# Patient Record
Sex: Male | Born: 1970 | Race: Black or African American | Hispanic: No | Marital: Married | Smoking: Former smoker
Health system: Southern US, Community
[De-identification: ages and names within clinical notes are randomized; demographics above are authoritative.]

## PROBLEM LIST (undated history)

## (undated) DIAGNOSIS — B009 Herpesviral infection, unspecified: Secondary | ICD-10-CM

## (undated) DIAGNOSIS — I10 Essential (primary) hypertension: Secondary | ICD-10-CM

---

## 2001-01-16 ENCOUNTER — Emergency Department (HOSPITAL_COMMUNITY): Admission: EM | Admit: 2001-01-16 | Discharge: 2001-01-16 | Payer: Self-pay | Admitting: Emergency Medicine

## 2010-08-01 ENCOUNTER — Emergency Department (HOSPITAL_COMMUNITY)
Admission: EM | Admit: 2010-08-01 | Discharge: 2010-08-01 | Disposition: A | Discharge status: Home / Self Care | Payer: Worker's Compensation | Attending: Emergency Medicine | Admitting: Emergency Medicine

## 2010-08-01 DIAGNOSIS — S91009A Unspecified open wound, unspecified ankle, initial encounter: Secondary | ICD-10-CM | POA: Insufficient documentation

## 2010-08-01 DIAGNOSIS — Y990 Civilian activity done for income or pay: Secondary | ICD-10-CM | POA: Insufficient documentation

## 2010-08-01 DIAGNOSIS — S81009A Unspecified open wound, unspecified knee, initial encounter: Secondary | ICD-10-CM | POA: Insufficient documentation

## 2010-08-01 DIAGNOSIS — W208XXA Other cause of strike by thrown, projected or falling object, initial encounter: Secondary | ICD-10-CM | POA: Insufficient documentation

## 2010-08-01 DIAGNOSIS — Y9269 Other specified industrial and construction area as the place of occurrence of the external cause: Secondary | ICD-10-CM | POA: Insufficient documentation

## 2012-10-27 DIAGNOSIS — I10 Essential (primary) hypertension: Secondary | ICD-10-CM | POA: Insufficient documentation

## 2012-10-27 DIAGNOSIS — R946 Abnormal results of thyroid function studies: Secondary | ICD-10-CM | POA: Insufficient documentation

## 2016-01-20 ENCOUNTER — Encounter (HOSPITAL_COMMUNITY): Payer: Self-pay | Admitting: Emergency Medicine

## 2016-01-20 ENCOUNTER — Emergency Department (HOSPITAL_COMMUNITY)
Admission: EM | Admit: 2016-01-20 | Discharge: 2016-01-20 | Disposition: A | Discharge status: Home / Self Care | Payer: BLUE CROSS/BLUE SHIELD | Attending: Emergency Medicine | Admitting: Emergency Medicine

## 2016-01-20 ENCOUNTER — Emergency Department (HOSPITAL_COMMUNITY): Payer: BLUE CROSS/BLUE SHIELD

## 2016-01-20 DIAGNOSIS — R079 Chest pain, unspecified: Secondary | ICD-10-CM

## 2016-01-20 DIAGNOSIS — Z79899 Other long term (current) drug therapy: Secondary | ICD-10-CM | POA: Insufficient documentation

## 2016-01-20 DIAGNOSIS — F17210 Nicotine dependence, cigarettes, uncomplicated: Secondary | ICD-10-CM | POA: Insufficient documentation

## 2016-01-20 DIAGNOSIS — R0789 Other chest pain: Secondary | ICD-10-CM | POA: Diagnosis present

## 2016-01-20 DIAGNOSIS — I10 Essential (primary) hypertension: Secondary | ICD-10-CM | POA: Diagnosis not present

## 2016-01-20 HISTORY — DX: Essential (primary) hypertension: I10

## 2016-01-20 HISTORY — DX: Herpesviral infection, unspecified: B00.9

## 2016-01-20 LAB — BASIC METABOLIC PANEL
Anion gap: 9 (ref 5–15)
BUN: 14 mg/dL (ref 6–20)
CO2: 26 mmol/L (ref 22–32)
CREATININE: 1.03 mg/dL (ref 0.61–1.24)
Calcium: 9.5 mg/dL (ref 8.9–10.3)
Chloride: 104 mmol/L (ref 101–111)
GFR calc Af Amer: >60 mL/min (ref >60)
GLUCOSE: 97 mg/dL (ref 65–99)
POTASSIUM: 3.9 mmol/L (ref 3.5–5.1)
Sodium: 139 mmol/L (ref 135–145)

## 2016-01-20 LAB — CBC
HEMATOCRIT: 42 % (ref 39.0–52.0)
Hemoglobin: 13.7 g/dL (ref 13.0–17.0)
MCH: 31.1 pg (ref 26.0–34.0)
MCHC: 32.6 g/dL (ref 30.0–36.0)
MCV: 95.5 fL (ref 78.0–100.0)
PLATELETS: 209 10*3/uL (ref 150–400)
RBC: 4.4 MIL/uL (ref 4.22–5.81)
RDW: 12.8 % (ref 11.5–15.5)
WBC: 4.5 10*3/uL (ref 4.0–10.5)

## 2016-01-20 LAB — TROPONIN I: Troponin I: <0.03 ng/mL (ref <0.03)

## 2016-01-20 NOTE — ED Provider Notes (Signed)
[facility]-EMERGENCY DEPT Provider Note   CSN: [id number] Arrival date & time: 01/20/16  4268  First Provider Contact:  None       History   Chief Complaint Chief Complaint  Patient presents with  . Chest Pain    HPI Wesley Peck is a 45 y.o. male.  Wesley Peck is a 45 y.o. Male with a 7 hour history of intermittent left chest wall pain which woke him from sleep around 1 AM.  He describes intermittent sharp stabbing pain lasting several seconds then resolves spontaneously.  He has had multiple episodes of this event which he felt was improving until he got to work this morning at which time it escalated.  His last episode of pain occurred 30 minutes prior to arrival.  He denies shortness of breath, nausea or emesis, no radiation of pain into his jaw or shoulder.  He denies abdominal pain.  Wife at bedside noted he was diaphoretic last night.  Risk factors include hypertension and cigarette use.  He has no significant family history of coronary disease at an early age.  He has found no alleviators for these episodes.  He denies palpitations and these episodes are not associated with exertion or deep inspiration.     The history is provided by the patient and the spouse.    Past Medical History:  Diagnosis Date  . Herpes   . Hypertension     There are no active problems to display for this patient.   History reviewed. No pertinent surgical history.     Home Medications    Prior to Admission medications   Medication Sig Start Date End Date Taking? Authorizing Provider  amLODipine (NORVASC) 5 MG tablet Take 1 tablet by mouth daily. 01/04/16  Yes Historical Provider, MD  hydrochlorothiazide (HYDRODIURIL) 25 MG tablet Take 1 tablet by mouth daily. 11/24/15  Yes Historical Provider, MD  lisinopril (PRINIVIL,ZESTRIL) 20 MG tablet Take 1 tablet by mouth daily. 12/26/15  Yes Historical Provider, MD  pravastatin (PRAVACHOL) 20 MG tablet Take 1 tablet by mouth daily. 12/10/15  Yes  Historical Provider, MD  valACYclovir (VALTREX) 1000 MG tablet Take 1 tablet by mouth daily as needed (flare ups).  01/07/16  Yes Historical Provider, MD    Family History History reviewed. No pertinent family history.  Social History Social History  Substance Use Topics  . Smoking status: Current Every Day Smoker    Packs/day: 0.50    Types: Cigarettes  . Smokeless tobacco: Never Used  . Alcohol use Yes     Comment: one beer a day      Allergies   Review of patient's allergies indicates no known allergies.   Review of Systems Review of Systems  Constitutional: Positive for diaphoresis. Negative for fever.  HENT: Negative for congestion and sore throat.   Eyes: Negative.   Respiratory: Negative for chest tightness, shortness of breath, wheezing and stridor.   Cardiovascular: Positive for chest pain. Negative for palpitations and leg swelling.  Gastrointestinal: Negative for abdominal pain and nausea.  Genitourinary: Negative.   Musculoskeletal: Negative for arthralgias, joint swelling and neck pain.  Skin: Negative.  Negative for rash and wound.  Neurological: Negative for dizziness, weakness, light-headedness, numbness and headaches.  Psychiatric/Behavioral: Negative.      Physical Exam Updated Vital Signs BP 141/94   Pulse (!) 58   Temp 98.1 F (36.7 C) (Oral)   Resp 18   Ht 6' (1.829 m)   Wt 77.1 kg   SpO2 100%  BMI 23.06 kg/m   Physical Exam  Constitutional: He appears well-developed and well-nourished.  HENT:  Head: Normocephalic and atraumatic.  Eyes: Conjunctivae are normal.  Neck: Normal range of motion.  Cardiovascular: Normal rate, regular rhythm, normal heart sounds and intact distal pulses.  Exam reveals no friction rub.   No murmur heard. Pulmonary/Chest: Effort normal and breath sounds normal. He has no wheezes. He exhibits no tenderness.  Abdominal: Soft. Bowel sounds are normal. There is no tenderness.  Musculoskeletal: Normal range of  motion. He exhibits no edema.  Neurological: He is alert.  Skin: Skin is warm and dry.  Psychiatric: He has a normal mood and affect.  Nursing note and vitals reviewed.    ED Treatments / Results  Labs Results for orders placed or performed during the hospital encounter of 01/20/16  Basic metabolic panel  Result Value Ref Range   Sodium 139 135 - 145 mmol/L   Potassium 3.9 3.5 - 5.1 mmol/L   Chloride 104 101 - 111 mmol/L   CO2 26 22 - 32 mmol/L   Glucose, Bld 97 65 - 99 mg/dL   BUN 14 6 - 20 mg/dL   Creatinine, Ser 1.61 0.61 - 1.24 mg/dL   Calcium 9.5 8.9 - 09.6 mg/dL   GFR calc non Af Amer >60 >60 mL/min   GFR calc Af Amer >60 >60 mL/min   Anion gap 9 5 - 15  CBC  Result Value Ref Range   WBC 4.5 4.0 - 10.5 K/uL   RBC 4.40 4.22 - 5.81 MIL/uL   Hemoglobin 13.7 13.0 - 17.0 g/dL   HCT 04.5 40.9 - 81.1 %   MCV 95.5 78.0 - 100.0 fL   MCH 31.1 26.0 - 34.0 pg   MCHC 32.6 30.0 - 36.0 g/dL   RDW 91.4 78.2 - 95.6 %   Platelets 209 150 - 400 K/uL  Troponin I  Result Value Ref Range   Troponin I <0.03 <0.03 ng/mL  Troponin I  Result Value Ref Range   Troponin I <0.03 <0.03 ng/mL   Dg Chest 2 View  Result Date: 01/20/2016 CLINICAL DATA:  Left-sided chest pain intermittently since this morning. History of hypertension and smoking. EXAM: CHEST  2 VIEW COMPARISON:  None. FINDINGS: The cardiomediastinal silhouette is within normal limits. The lungs are hyperinflated. The lung apices were incompletely imaged. No airspace consolidation, edema, pleural effusion, or pneumothorax is identified. No acute osseous abnormality is seen. IMPRESSION: Hyperinflation without evidence of acute cardiopulmonary process. Electronically Signed   By: Sebastian Ache M.D.   On: 01/20/2016 09:14    EKG  EKG Interpretation  Date/Time:  Tuesday January 20 2016 08:20:43 EDT Ventricular Rate:  66 PR Interval:    QRS Duration: 85 QT Interval:  385 QTC Calculation: 404 R Axis:   81 Text Interpretation:   Sinus rhythm Consider left ventricular hypertrophy ST elev, probable normal early repol pattern No old tracing to compare Confirmed by [facility]  MD, ELLIOTT [phone]) on 01/20/2016 8:29:52 AM       Radiology Dg Chest 2 View  Result Date: 01/20/2016 CLINICAL DATA:  Left-sided chest pain intermittently since this morning. History of hypertension and smoking. EXAM: CHEST  2 VIEW COMPARISON:  None. FINDINGS: The cardiomediastinal silhouette is within normal limits. The lungs are hyperinflated. The lung apices were incompletely imaged. No airspace consolidation, edema, pleural effusion, or pneumothorax is identified. No acute osseous abnormality is seen. IMPRESSION: Hyperinflation without evidence of acute cardiopulmonary process. Electronically Signed   By: Freida Busman  Mosetta Putt M.D.   On: 01/20/2016 09:14    Procedures Procedures (including critical care time)  Medications Ordered in ED Medications - No data to display   Initial Impression / Assessment and Plan / ED Course  I have reviewed the triage vital signs and the nursing notes.  Pertinent labs & imaging results that were available during my care of the patient were reviewed by me and considered in my medical decision making (see chart for details).  Clinical Course    Pt with atypical chest pain symptoms, not pleuritic and not exertional. No sx during course of ed visit.  Labs, ekg and cxr unremarkable including delta troponins.  Doubt ACS as source. He was encouraged f/u with pcp within the next week given cardiac risk factors, would benefit from exercise stress test.    Discussed with Dr. Effie Shy prior to dc home.  Final Clinical Impressions(s) / ED Diagnoses   Final diagnoses:  Chest pain, unspecified chest pain type    New Prescriptions New Prescriptions   No medications on file     Burgess Amor, PA-C 01/20/16 1216    Mancel Bale, MD 01/20/16 [phone]

## 2016-01-20 NOTE — ED Triage Notes (Signed)
Pt reports left sided cp since yesterday, no radiation.  Pt denies all associating symptoms. Pt alert and oriented. Has not taken any medication for pain.

## 2018-12-15 ENCOUNTER — Other Ambulatory Visit: Payer: Self-pay | Admitting: Internal Medicine

## 2018-12-15 ENCOUNTER — Other Ambulatory Visit: Payer: Self-pay

## 2018-12-15 DIAGNOSIS — Z20822 Contact with and (suspected) exposure to covid-19: Secondary | ICD-10-CM

## 2018-12-15 NOTE — Progress Notes (Unsigned)
[id number] 

## 2018-12-20 LAB — NOVEL CORONAVIRUS, NAA: SARS-CoV-2, NAA: NOT DETECTED

## 2021-06-24 ENCOUNTER — Emergency Department (HOSPITAL_COMMUNITY)
Admission: EM | Admit: 2021-06-24 | Discharge: 2021-06-24 | Disposition: A | Discharge status: Home / Self Care | Payer: BC Managed Care – PPO | Attending: Emergency Medicine | Admitting: Emergency Medicine

## 2021-06-24 ENCOUNTER — Other Ambulatory Visit: Payer: Self-pay

## 2021-06-24 ENCOUNTER — Emergency Department (HOSPITAL_COMMUNITY): Payer: BC Managed Care – PPO

## 2021-06-24 ENCOUNTER — Encounter (HOSPITAL_COMMUNITY): Payer: Self-pay | Admitting: *Deleted

## 2021-06-24 DIAGNOSIS — I10 Essential (primary) hypertension: Secondary | ICD-10-CM | POA: Diagnosis not present

## 2021-06-24 DIAGNOSIS — Z79899 Other long term (current) drug therapy: Secondary | ICD-10-CM | POA: Insufficient documentation

## 2021-06-24 DIAGNOSIS — R0789 Other chest pain: Secondary | ICD-10-CM | POA: Diagnosis not present

## 2021-06-24 LAB — TROPONIN I (HIGH SENSITIVITY)
Troponin I (High Sensitivity): 2 ng/L (ref <18)
Troponin I (High Sensitivity): 2 ng/L (ref <18)

## 2021-06-24 LAB — CBC
HCT: 39.2 % (ref 39.0–52.0)
Hemoglobin: 13 g/dL (ref 13.0–17.0)
MCH: 31.6 pg (ref 26.0–34.0)
MCHC: 33.2 g/dL (ref 30.0–36.0)
MCV: 95.4 fL (ref 80.0–100.0)
Platelets: 208 10*3/uL (ref 150–400)
RBC: 4.11 MIL/uL — ABNORMAL LOW (ref 4.22–5.81)
RDW: 12.5 % (ref 11.5–15.5)
WBC: 3.9 10*3/uL — ABNORMAL LOW (ref 4.0–10.5)
nRBC: 0 % (ref 0.0–0.2)

## 2021-06-24 LAB — BASIC METABOLIC PANEL
Anion gap: 10 (ref 5–15)
BUN: 23 mg/dL — ABNORMAL HIGH (ref 6–20)
CO2: 23 mmol/L (ref 22–32)
Calcium: 9.4 mg/dL (ref 8.9–10.3)
Chloride: 105 mmol/L (ref 98–111)
Creatinine, Ser: 1.17 mg/dL (ref 0.61–1.24)
GFR, Estimated: >60 mL/min (ref >60)
Glucose, Bld: 138 mg/dL — ABNORMAL HIGH (ref 70–99)
Potassium: 4.1 mmol/L (ref 3.5–5.1)
Sodium: 138 mmol/L (ref 135–145)

## 2021-06-24 LAB — D-DIMER, QUANTITATIVE: D-Dimer, Quant: 0.37 ug/mL-FEU (ref 0.00–0.50)

## 2021-06-24 NOTE — ED Triage Notes (Signed)
Pt brought in by [facility] for c/o chest pain that started last night while lying on the couch; pt describes the pain as an intermittent pressure; pt states he did have some diaphoresis with the chest pain with ems; pt given one nitro and 324mg  asa en route by ems with no relief

## 2021-06-24 NOTE — ED Provider Notes (Signed)
[facility] EMERGENCY DEPARTMENT Provider Note   CSN: [id number] Arrival date & time: 06/24/21  1246     History Hypertension, hyperlipidemia  Chief Complaint  Patient presents with   Chest Pain    Wesley Peck is a 51 y.o. male presents emergency department chief complaint of chest pain.  He has a past medical history of hypertension, hyperlipidemia.  Patient states that yesterday he began having chest pressure lasting several minutes at a time and then easing off.  No aggravating or alleviating factors.  He states that when it comes on he has to stop.  Pain does not radiate.  He denies nausea vomiting or diaphoresis but does endorse feeling short of breath.  He denies smoking, diabetes or family history of MI.  He has no active chest pain at this time.  He has had similar symptoms in the past.  He denies cough, fevers, chills, unilateral leg swelling history of PE or DVT.  The history is provided by the patient.  Chest Pain  HPI: A 51 year old patient with a history of hypertension and hypercholesterolemia presents for evaluation of chest pain. Initial onset of pain was more than 6 hours ago. The patient's chest pain is described as heaviness/pressure/tightness and is not worse with exertion. The patient's chest pain is middle- or left-sided, is not well-localized, is not sharp and does not radiate to the arms/jaw/neck. The patient does not complain of nausea and denies diaphoresis. The patient has no history of stroke, has no history of peripheral artery disease, has not smoked in the past 90 days, denies any history of treated diabetes, has no relevant family history of coronary artery disease (first degree relative at less than age 69) and does not have an elevated BMI (>=30).   Home Medications Prior to Admission medications   Medication Sig Start Date End Date Taking? Authorizing Provider  amLODipine (NORVASC) 5 MG tablet Take 1 tablet by mouth daily. 01/04/16  Yes [provider]  hydrOXYzine (ATARAX) 25 MG tablet Take 25 mg by mouth 3 (three) times daily as needed. 06/19/21  Yes [provider]  ibuprofen (ADVIL) 800 MG tablet Take 800 mg by mouth every 8 (eight) hours as needed. 03/20/21  Yes [provider]  lisinopril (PRINIVIL,ZESTRIL) 20 MG tablet Take 1 tablet by mouth daily. 12/26/15  Yes [provider]  omeprazole (PRILOSEC) 40 MG capsule Take 40 mg by mouth daily. 06/05/21  Yes [provider]  pravastatin (PRAVACHOL) 20 MG tablet Take 1 tablet by mouth daily. 12/10/15  Yes [provider]  sildenafil (VIAGRA) 100 MG tablet Take 50-100 mg by mouth daily as needed. 04/18/21  Yes [provider]  triamcinolone (KENALOG) 0.025 % ointment Apply topically 2 (two) times daily. 06/19/21  Yes [provider]  valACYclovir (VALTREX) 1000 MG tablet Take 1 tablet by mouth daily as needed (flare ups).  01/07/16  Yes [provider]      Allergies    Alka-seltzer [aspirin effervescent]    Review of Systems   Review of Systems  Cardiovascular:  Positive for chest pain.  As per HPI Physical Exam Updated Vital Signs BP (!) 143/93    Pulse 72    Temp 98.6 F (37 C)    Resp 16    Ht 6' (1.829 m)    Wt 86.2 kg    SpO2 98%    BMI 25.77 kg/m  Physical Exam Vitals and nursing note reviewed.  Constitutional:      General:  He is not in acute distress.    Appearance: He is well-developed. He is not diaphoretic.  HENT:     Head: Normocephalic and atraumatic.  Eyes:     General: No scleral icterus.    Conjunctiva/sclera: Conjunctivae normal.  Cardiovascular:     Rate and Rhythm: Normal rate and regular rhythm.     Heart sounds: Normal heart sounds.  Pulmonary:     Effort: Pulmonary effort is normal. No respiratory distress.     Breath sounds: Normal breath sounds.  Chest:     Comments: No chest wall tenderness Abdominal:     Palpations: Abdomen is soft.     Tenderness: There is no  abdominal tenderness.  Musculoskeletal:     Cervical back: Normal range of motion and neck supple.     Right lower leg: No edema.     Left lower leg: No edema.  Skin:    General: Skin is warm and dry.  Neurological:     Mental Status: He is alert.  Psychiatric:        Behavior: Behavior normal.    ED Results / Procedures / Treatments   Labs (all labs ordered are listed, but only abnormal results are displayed) Labs Reviewed  BASIC METABOLIC PANEL - Abnormal; Notable for the following components:      Result Value   Glucose, Bld 138 (*)    BUN 23 (*)    All other components within normal limits  CBC - Abnormal; Notable for the following components:   WBC 3.9 (*)    RBC 4.11 (*)    All other components within normal limits  D-DIMER, QUANTITATIVE  TROPONIN I (HIGH SENSITIVITY)  TROPONIN I (HIGH SENSITIVITY)    EKG EKG Interpretation  Date/Time:  Wednesday June 24 2021 12:56:28 EST Ventricular Rate:  106 PR Interval:  158 QRS Duration: 84 QT Interval:  336 QTC Calculation: 446 R Axis:   47 Text Interpretation: Sinus tachycardia Otherwise normal ECG When compared with ECG of 20-Jan-2016 08:20, PREVIOUS ECG IS PRESENT SINCE LAST TRACING HEART RATE HAS INCREASED Confirmed by Jacalyn Lefevre [id number]) on 06/24/2021 3:40:35 PM  Radiology DG Chest 2 View  Result Date: 06/24/2021 CLINICAL DATA:  Chest pain since last night EXAM: CHEST - 2 VIEW COMPARISON:  01/20/2016 FINDINGS: The heart size and mediastinal contours are within normal limits. Both lungs are clear. The visualized skeletal structures are unremarkable. IMPRESSION: No active cardiopulmonary disease. Electronically Signed   By: Signa Kell M.D.   On: 06/24/2021 13:11    Procedures Procedures    Medications Ordered in ED Medications - No data to display  ED Course/ Medical Decision Making/ A&P Clinical Course as of 06/25/21 0850  Wed Jun 24, 2021  1352 Independently interpreted EKG findings which shows sinus  tachycardia at a rate of 106 without ischemic evidence, no S1Q3T3 [AH]    Clinical Course User Index [AH] Arthor Captain, PA-C   HEAR Score: 3                       Medical Decision Making Patient here with cp. Initially tachycardic.  I considered CTA- but given the patient's low risk Wells score I ordered a D dimer which was negative.  HEART score is 3 and  using the risk score there does not appear to be ACS.  Given the large differential diagnosis for HOLDYN BERGAMO, the decision making in this case is of high complexity.  After evaluating all of the  data points in this case, the presentation of Wesley Peck is NOT consistent with Acute Coronary Syndrome (ACS) and/or myocardial ischemia, pulmonary embolism, aortic dissection; Borhaave's, significant arrythmia, pneumothorax, cardiac tamponade, or other emergent cardiopulmonary condition.  Further, the presentation of COYOTE MAGALHAES is NOT consistent with pericarditis, myocarditis, cholecystitis, pancreatitis, mediastinitis, endocarditis, new valvular disease.  Additionally, the presentation of Suhail A Englandis NOT consistent with flail chest, cardiac contusion, ARDS, or significant intra-thoracic or intra-abdominal bleeding.  Moreover, this presentation is NOT consistent with pneumonia, sepsis, or pyelonephritis.    Strict return and follow-up precautions have been given by me personally or by detailed written instruction given verbally by nursing staff using the teach back method to the patient/family/caregiver(s).  Data Reviewed/Counseling: I have reviewed the patient's vital signs, nursing notes, and other relevant tests/information. I had a detailed discussion regarding the historical points, exam findings, and any diagnostic results supporting the discharge diagnosis. I also discussed the need for outpatient follow-up and the need to return to the ED if symptoms worsen or if there are any questions or concerns that arise  at home.     Amount and/or Complexity of Data Reviewed Independent Historian: spouse    Details: discussed prevous work up for similar sxs Labs: ordered.    Details: bmp- mildly elevated glucose Troponin wnl x 2 cbc -without acute findings D-dimer- negative Radiology: ordered and independent interpretation performed.    Details: 2 v cxr shows no edema, consolidations, cardiomegaly I agree with radiologic interpretation ECG/medicine tests: ordered and independent interpretation performed. Decision-making details documented in ED Course.      Final Clinical Impression(s) / ED Diagnoses Final diagnoses:  Atypical chest pain    Rx / DC Orders ED Discharge Orders     None         Margarita Mail, PA-C 06/25/21 0915    Isla Pence, MD 06/25/21 [phone]

## 2021-06-24 NOTE — Discharge Instructions (Signed)

## 2022-12-29 ENCOUNTER — Other Ambulatory Visit (HOSPITAL_COMMUNITY): Payer: Self-pay | Admitting: Nurse Practitioner

## 2022-12-29 DIAGNOSIS — Z87891 Personal history of nicotine dependence: Secondary | ICD-10-CM

## 2023-02-09 ENCOUNTER — Ambulatory Visit (HOSPITAL_COMMUNITY): Payer: BC Managed Care – PPO

## 2023-02-09 ENCOUNTER — Encounter (HOSPITAL_COMMUNITY): Payer: Self-pay

## 2023-02-09 ENCOUNTER — Ambulatory Visit (HOSPITAL_COMMUNITY)
Admission: RE | Admit: 2023-02-09 | Discharge: 2023-02-09 | Disposition: A | Discharge status: Home / Self Care | Payer: BC Managed Care – PPO | Source: Ambulatory Visit | Attending: Nurse Practitioner | Admitting: Nurse Practitioner

## 2023-02-09 DIAGNOSIS — Z87891 Personal history of nicotine dependence: Secondary | ICD-10-CM | POA: Diagnosis not present

## 2023-08-14 IMAGING — DX DG CHEST 2V
2 series · 2 of 2 positions shown · non-contrast
Comparison: 01/20/2016

CLINICAL DATA: Chest pain since last night

EXAM:
CHEST - 2 VIEW

[chest pa]
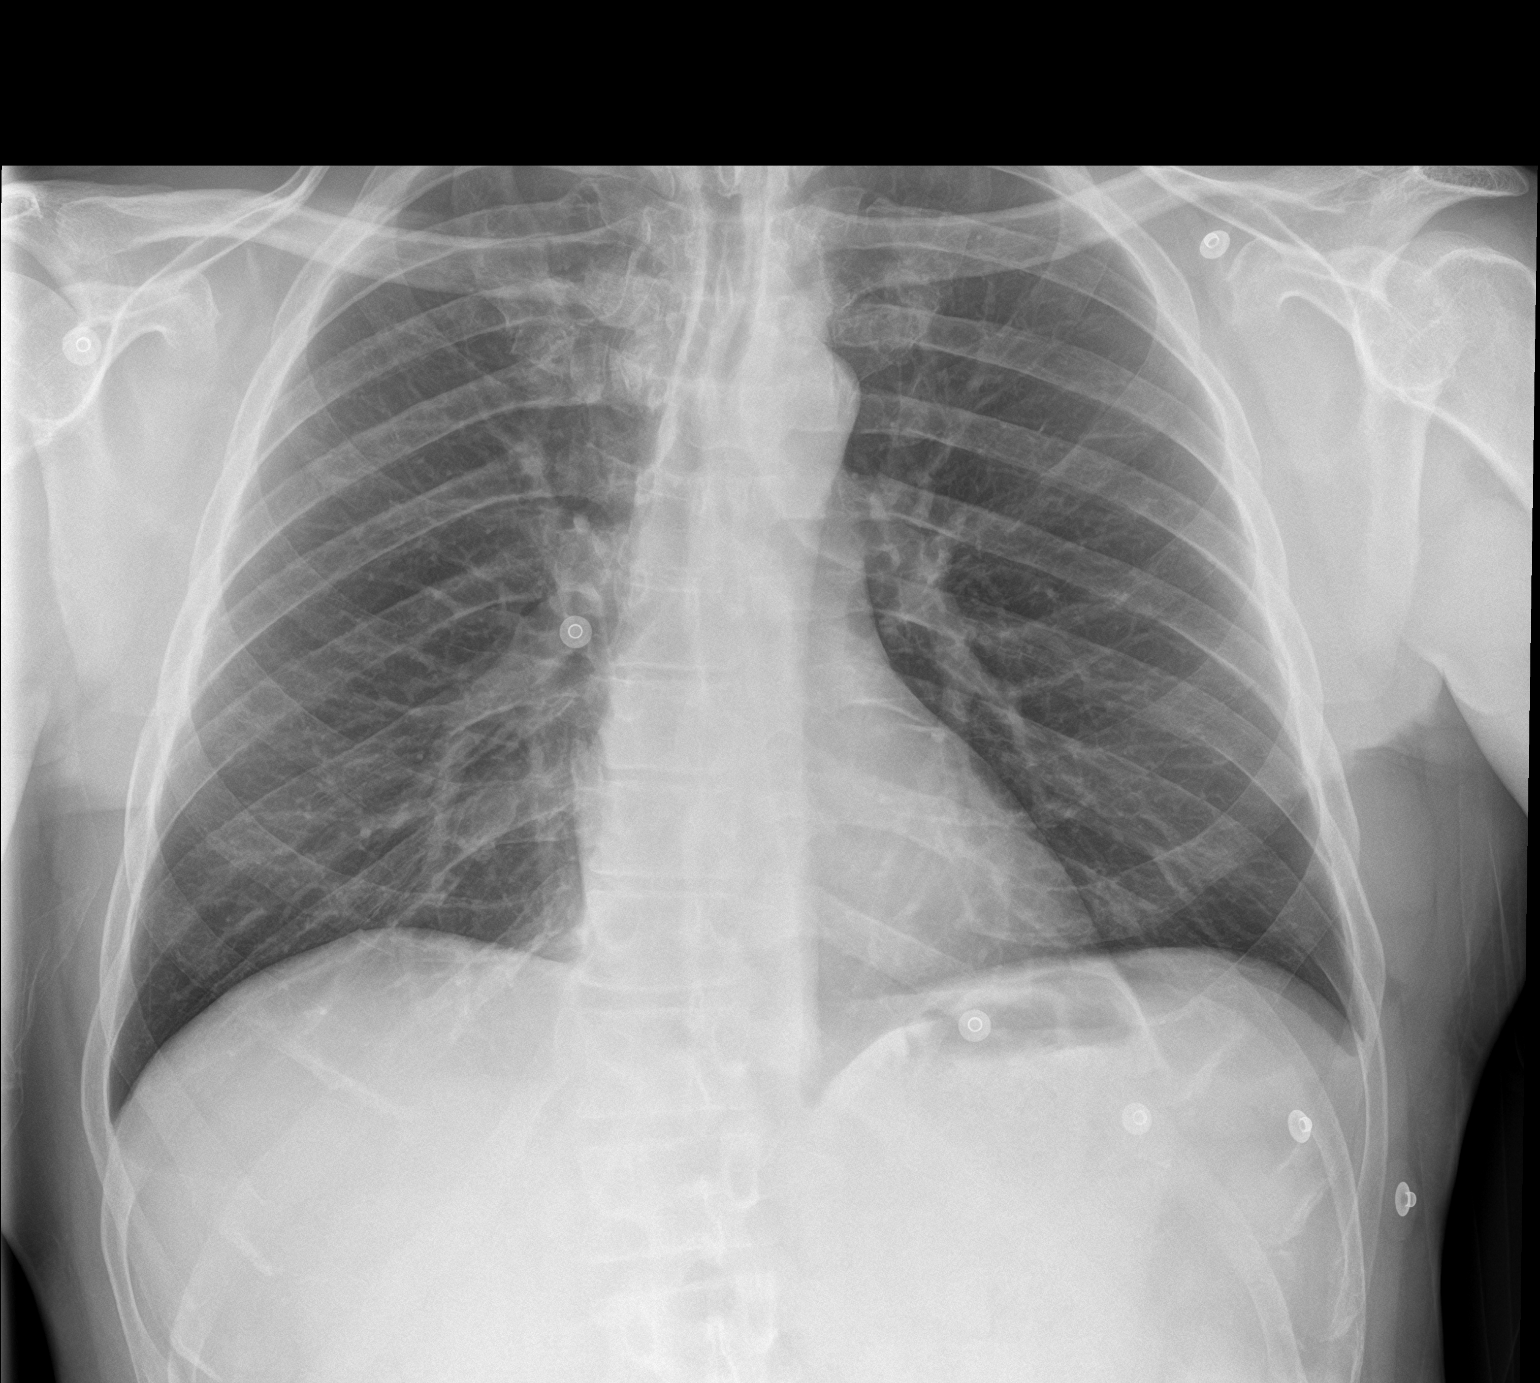

[chest lat]
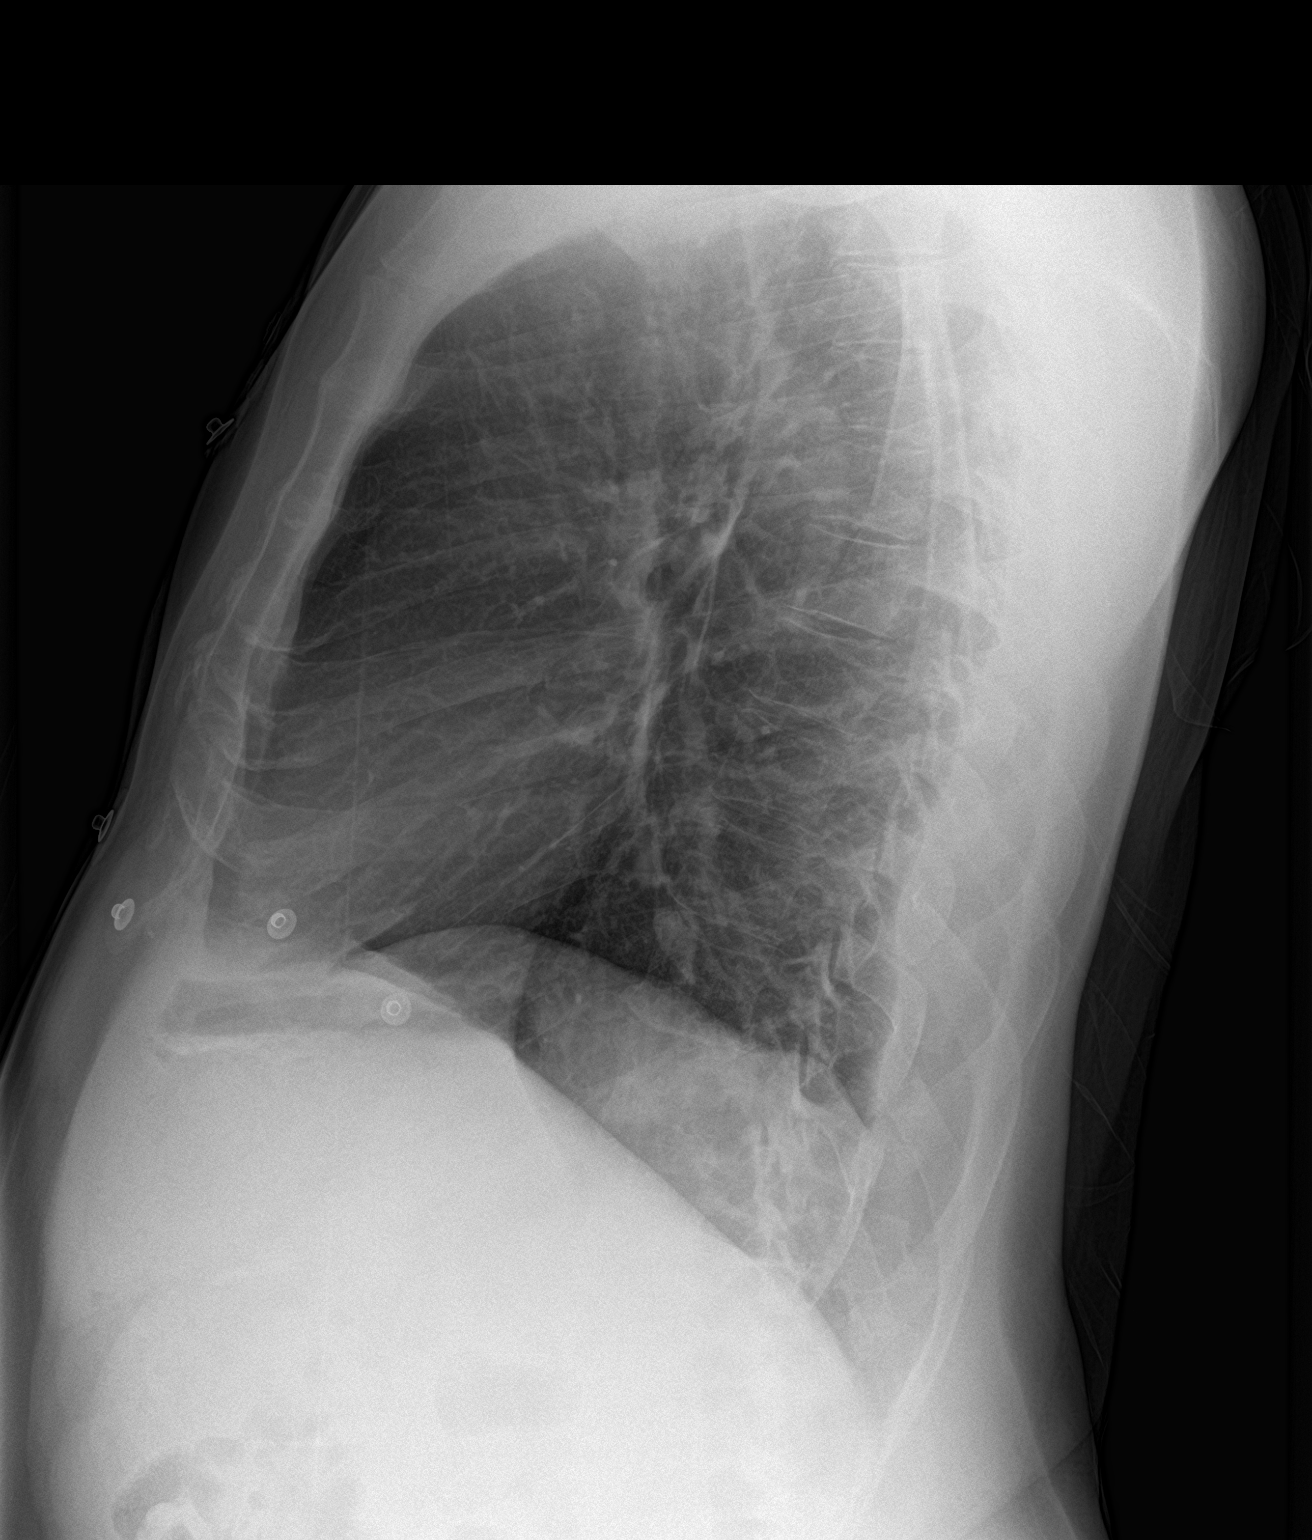

[2 of 2 positions shown; findings below may reference images not displayed]

FINDINGS: The heart size and mediastinal contours are within normal limits.
Both lungs are clear. The visualized skeletal structures are
unremarkable.
IMPRESSION: No active cardiopulmonary disease.

## 2023-10-28 ENCOUNTER — Encounter: Payer: Self-pay | Admitting: Orthopedic Surgery

## 2023-10-28 ENCOUNTER — Other Ambulatory Visit (INDEPENDENT_AMBULATORY_CARE_PROVIDER_SITE_OTHER): Payer: Self-pay

## 2023-10-28 ENCOUNTER — Ambulatory Visit (INDEPENDENT_AMBULATORY_CARE_PROVIDER_SITE_OTHER): Payer: Worker's Compensation | Admitting: Orthopedic Surgery

## 2023-10-28 VITALS — BP 141/83 | HR 71 | Ht 72.0 in | Wt 179.0 lb

## 2023-10-28 DIAGNOSIS — M79604 Pain in right leg: Secondary | ICD-10-CM

## 2023-10-28 DIAGNOSIS — M79605 Pain in left leg: Secondary | ICD-10-CM

## 2023-10-28 DIAGNOSIS — M545 Low back pain, unspecified: Secondary | ICD-10-CM | POA: Diagnosis not present

## 2023-10-28 DIAGNOSIS — R7989 Other specified abnormal findings of blood chemistry: Secondary | ICD-10-CM | POA: Insufficient documentation

## 2023-10-28 NOTE — Progress Notes (Signed)
 New Patient Visit  Assessment: Wesley Peck is a 53 y.o. male with the following: 1. Lumbar pain with radiation down both legs  Plan: QUAYLON AVANT has pain in the lower back.  He has occasional radiating pains into both legs, more so on the right.  Radiographs obtained in clinic today demonstrates some degenerative changes, especially at L5-S1.  Thus far, he is only been taking medications occasionally.  I recommended more consistent use of NSAIDs, as well as physical therapy.  If he continues to have issues after working with physical therapy, we may have to consider an MRI.  This was discussed with the patient.  He states understanding.  Follow-up in 2 months.  Follow-up: Return in about 2 months (around 12/28/2023).  Subjective:  Chief Complaint  Patient presents with   Back Pain    Pt with chronic LBP with radiation down both legs R>L.     History of Present Illness: Wesley Peck is a 53 y.o. male who has been referred by Rosie Cookey, FNP for evaluation of low back pain.  He states he said pain in his lower back for over a year.  No specific injury.  Pain is across lower back, with radiating pains into both legs.  More consistent radiating pains into the right leg versus the left.  Occasional numbness and tingling into the right foot.  He takes medicines once in a while.  He has not worked with therapy.  No prior injections.   Review of Systems: No fevers or chills No numbness or tingling No chest pain No shortness of breath No bowel or bladder dysfunction No GI distress No headaches   Medical History:  Past Medical History:  Diagnosis Date   Herpes    Hypertension     History reviewed. No pertinent surgical history.  History reviewed. No pertinent family history. Social History   Tobacco Use   Smoking status: Former    Current packs/day: 0.50    Types: Cigarettes   Smokeless tobacco: Never  Vaping Use   Vaping status: Never Used  Substance Use  Topics   Alcohol use: Yes    Comment: one beer a day    Drug use: No    Allergies  Allergen Reactions   Alka-Seltzer [Aspirin Effervescent]     Current Meds  Medication Sig   amLODipine (NORVASC) 5 MG tablet Take 1 tablet by mouth daily.   hydrOXYzine (ATARAX) 25 MG tablet Take 25 mg by mouth 3 (three) times daily as needed.   ibuprofen (ADVIL) 800 MG tablet Take 800 mg by mouth every 8 (eight) hours as needed.   lisinopril (PRINIVIL,ZESTRIL) 20 MG tablet Take 1 tablet by mouth daily.   omeprazole (PRILOSEC) 40 MG capsule Take 40 mg by mouth daily.   pravastatin (PRAVACHOL) 20 MG tablet Take 1 tablet by mouth daily.   sildenafil (VIAGRA) 100 MG tablet Take 50-100 mg by mouth daily as needed.   triamcinolone (KENALOG) 0.025 % ointment Apply topically 2 (two) times daily.   valACYclovir (VALTREX) 1000 MG tablet Take 1 tablet by mouth daily as needed (flare ups).     Objective: BP (!) 141/83   Pulse 71   Ht 6' (1.829 m)   Wt 179 lb (81.2 kg)   BMI 24.28 kg/m   Physical Exam:  General: Alert and oriented. and No acute distress. Gait: Normal gait.  Evaluation of lumbar spine demonstrates no deformity.  No redness.  Point tenderness.  Negative straight leg raise bilaterally.  He has excellent lower body strength bilaterally.  2+ patellar tendon reflexes bilateral.  Sensation intact throughout both legs at this time.  IMAGING: I personally ordered and reviewed the following images   Standing lumbar spine x-rays were obtained in clinic today.  No acute injuries noted.  There is no anterolisthesis.  Well-maintained disc height, with the exception of L5-S1.  At L5-S1, there are large anterior based osteophytes, and loss of disc height.  No bony lesions.  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   New Medications:  No orders of the defined types were placed in this encounter.     Tonita Frater, MD  10/28/2023 10:38 AM

## 2023-12-01 ENCOUNTER — Encounter (HOSPITAL_COMMUNITY): Payer: Self-pay

## 2023-12-01 ENCOUNTER — Ambulatory Visit (HOSPITAL_COMMUNITY): Attending: Orthopedic Surgery

## 2023-12-01 ENCOUNTER — Other Ambulatory Visit: Payer: Self-pay

## 2023-12-01 DIAGNOSIS — Z7409 Other reduced mobility: Secondary | ICD-10-CM | POA: Insufficient documentation

## 2023-12-01 DIAGNOSIS — M79604 Pain in right leg: Secondary | ICD-10-CM | POA: Diagnosis present

## 2023-12-01 DIAGNOSIS — M5459 Other low back pain: Secondary | ICD-10-CM | POA: Insufficient documentation

## 2023-12-01 DIAGNOSIS — M79605 Pain in left leg: Secondary | ICD-10-CM | POA: Insufficient documentation

## 2023-12-01 DIAGNOSIS — M545 Low back pain, unspecified: Secondary | ICD-10-CM | POA: Diagnosis not present

## 2023-12-01 NOTE — Therapy (Signed)
 OUTPATIENT PHYSICAL THERAPY THORACOLUMBAR EVALUATION   Patient Name: Wesley Peck MRN: [id number] DOB:04-Dec-1970, 53 y.o., male Today's Date: 12/01/2023  END OF SESSION:  PT End of Session - 12/01/23 1645     Visit Number 1    Date for PT Re-Evaluation 01/12/24    Authorization Type BCBS COMM PPO    Authorization Time Period seeking auth    Authorization - Visit Number 0    Progress Note Due on Visit 10    PT Start Time 1557    PT Stop Time 1640    PT Time Calculation (min) 43 min    Activity Tolerance Patient tolerated treatment well;Patient limited by pain    Behavior During Therapy WFL for tasks assessed/performed          Past Medical History:  Diagnosis Date   Herpes    Hypertension    History reviewed. No pertinent surgical history. Patient Active Problem List   Diagnosis Date Noted   Low testosterone 10/28/2023   Abnormal thyroid function test 10/27/2012   Essential hypertension 10/27/2012    PCP: Ayesha Lente, FNP   REFERRING PROVIDER: Tonita Frater, MD  REFERRING DIAG: M54.50,M79.604,M79.605 (ICD-10-CM) - Lumbar pain with radiation down both legs  Rationale for Evaluation and Treatment: Rehabilitation  THERAPY DIAG:  Other low back pain  Impaired functional mobility and activity tolerance  Pain of right lower extremity  ONSET DATE: A long time ago  SUBJECTIVE:                                                                                                                                                                                           SUBJECTIVE STATEMENT: Pt states he has been dealing with low back pain for years. 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 Pt states pain does go down both legs but Right is worse that Left. Pt does not know what started low back pain, just came on. Pt states back has been hurting for over 10 years.   PERTINENT  HISTORY:  -HTN, controlled  PAIN:  Are you having pain? Yes: NPRS scale: 6/10 Pain location: low back, both legs, right worse than left Pain description: sharp, shooting, burning Aggravating factors: early in the morning, sittting and standing Relieving factors: laying flat, getting up and moving  PRECAUTIONS: None  RED FLAGS: Bowel or bladder incontinence: Yes: sometimes can not hold it. Pt states that he is urinating more than he used to. Only had one soda today and has used the bathroom several times  WEIGHT BEARING RESTRICTIONS: No  FALLS:  Has patient fallen in last 6 months? 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  DIAGNOSTIC FINDINGS:  Standing lumbar spine x-rays were obtained in clinic today.  No acute injuries noted.  There is no anterolisthesis.  Well-maintained disc height, with the exception of L5-S1.  At L5-S1, there are large anterior based osteophytes, and loss of disc height.  No bony lesions.   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  PATIENT SURVEYS:  Modified Oswestry 15/50   COGNITION: Overall cognitive status: Within functional limits for tasks assessed     SENSATION: Burning feeling in both feet, medial arch  POSTURE: rounded shoulders and forward head  PALPATION: Pt demonstrates tenderness to palpation of L5-S1 spinal segments and CPA and UPA mobilizations, tenderness also noted in R glute and bilateral greater trochanters.  LUMBAR ROM:   AROM eval  Flexion 52 degrees, pain in right side of bakc  Extension WFl  Right lateral flexion 20, pain on R low back  Left lateral flexion 25, no pain  Right rotation  75% available  Left rotation 75% available   (Blank rows = not tested)  LOWER EXTREMITY ROM:     Active  Right eval Left eval  Hip flexion    Hip extension    Hip abduction    Hip adduction    Hip internal rotation    Hip external rotation    Knee flexion    Knee extension    Ankle dorsiflexion    Ankle plantarflexion    Ankle inversion    Ankle eversion     (Blank rows = not tested)  LOWER EXTREMITY MMT:    MMT Right eval Left eval  Hip flexion 4- 4+  Hip extension 4-, pain 4  Hip abduction 3+, pain 4-  Hip adduction    Hip internal rotation    Hip external rotation    Knee flexion 4- 4+  Knee extension 4- 4+  Ankle dorsiflexion 4- 4+  Ankle plantarflexion    Ankle inversion    Ankle eversion     (Blank rows = not tested)  LUMBAR SPECIAL TESTS:  Straight leg raise test: Positive and Slump test: Negative  FUNCTIONAL TESTS:  5 times sit to stand: 18.56, pain in right leg 2 minute walk test: 422, same pain level  GAIT: Distance walked: 422 feet Assistive device utilized: None Level of assistance: Complete Independence Comments: pt demonstrates decreased foot clearance with RLE compared to left, no increased pain reported and no increased deviations noted as progressed. Pt states he has to walk for 10 miles a day at work.  TREATMENT DATE:  12/01/2023  Evaluation: -ROM measured, Strength assessed, HEP prescribed, pt educated on prognosis, findings, and importance of HEP compliance if given.  PATIENT EDUCATION:  Education details: Pt was educated on findings of PT evaluation, prognosis, frequency of therapy visits and rationale, attendance policy, and HEP if given.   Person educated: Patient Education method: Explanation, Verbal cues, and Handouts Education comprehension: verbalized understanding, verbal cues required, and needs further  education  HOME EXERCISE PROGRAM: Access Code: [facility] URL: [email or web address] Date: 12/01/2023 Prepared by: Armond Bertin  Exercises - Supine Lower Trunk Rotation  - 1 x daily - 7 x weekly - 3 sets - 10 reps - Supine Bridge  - 1 x daily - 7 x weekly - 3 sets - 10 reps - 3 seconds hold - Clamshell  - 1 x daily - 7 x weekly - 3 sets - 10 reps  ASSESSMENT:  CLINICAL IMPRESSION: Patient is a 53 y.o. male who was seen today for physical therapy evaluation and treatment for M54.50,M79.604,M79.605 (ICD-10-CM) - Lumbar pain with radiation down both legs.  Patient demonstrates low back pain, right LE pain, decreased RLE strength, and tenderness to palpation to L5-S1 spinal segments. Patient also demonstrates WFL gait pattern with decreased foot clearance of RLE noted during today's . Patient also demonstrates increase tone in lumbar paraspinal musculature, more on R side than left. Patient requires education on POC, role of PT and HEP prescription. Patient would benefit from skilled physical therapy for decreased pain, increased pain free endurance with ambulation, increased RLE strength, and activity tolerance for improved gait quality, return to higher level of function with ADLs, and progress towards therapy goals.    OBJECTIVE IMPAIRMENTS: Abnormal gait, decreased activity tolerance, decreased endurance, decreased mobility, difficulty walking, decreased ROM, decreased strength, impaired flexibility, impaired sensation, and pain.   ACTIVITY LIMITATIONS: carrying, lifting, bending, sitting, standing, squatting, and sleeping  PARTICIPATION LIMITATIONS: cleaning, laundry, community activity, occupation, and yard work  PERSONAL FACTORS: Time since onset of injury/illness/exacerbation and 1 comorbidity: HTN are also affecting patient's functional outcome.   REHAB POTENTIAL: Fair chronic in nature  CLINICAL DECISION MAKING: Stable/uncomplicated  EVALUATION COMPLEXITY:  Low   GOALS: Goals reviewed with patient? No  SHORT TERM GOALS: Target date: 12/22/23  Pt will be independent with HEP in order to demonstrate participation in Physical Therapy POC.  Baseline: Goal status: INITIAL  2.  Pt will report 4/10 pain at worst with mobility in order to demonstrate improved pain with ADLs.  Baseline: 6/10 Goal status: INITIAL  LONG TERM GOALS: Target date: 01/12/24  Pt will improve 5TSTS by 2.3 seconds in order to demonstrate improved functional strength to return to desired activities.  Baseline: see objective.  Goal status: INITIAL  2.  Pt will improve 2 MWT by 50 feet in order to demonstrate improved functional ambulatory capacity in community setting.  Baseline: see objective.  Goal status: INITIAL  3.  Pt will improve Modified Oswestry score by at least 6 points in order to demonstrate improved pain with functional goals and outcomes. Baseline: see objective.  Goal status: INITIAL  4.  Pt will report 2/10 pain at worst with mobility in order to demonstrate reduced pain with ADLs lasting greater than 30 minutes.  Baseline: see objective.  Goal status: INITIAL   PLAN:  PT FREQUENCY: 1-2x/week  PT DURATION: 6 weeks  PLANNED INTERVENTIONS: 97110-Therapeutic exercises, 97530- Therapeutic activity, 97112- Neuromuscular re-education, 97535- Self Care, 14782- Manual therapy, [phone]- Gait training, Patient/Family education, Balance training, Stair training, Joint mobilization, Spinal mobilization, DME instructions, Cryotherapy, and Moist heat.  PLAN FOR NEXT SESSION: review goals and HEP, progress LE and core strengthening, manual to  lumbar paraspinals   Armond Bertin, PT, DPT [facility] Office: [phone] 4:48 PM, 12/01/23   Managed Medicaid Authorization Request  Visit Dx Codes: M54.59 ;  Z74.09 ; M79.604   Functional Tool Score: Modified Oswestry 15/50   For all possible CPT codes, reference the Planned  Interventions line above.     Check all conditions that are expected to impact treatment: {Conditions expected to impact treatment:Unknown   If treatment provided at initial evaluation, no treatment charged due to lack of authorization.

## 2023-12-14 ENCOUNTER — Ambulatory Visit (HOSPITAL_COMMUNITY): Payer: Worker's Compensation

## 2023-12-14 DIAGNOSIS — M5459 Other low back pain: Secondary | ICD-10-CM | POA: Diagnosis not present

## 2023-12-14 DIAGNOSIS — M79604 Pain in right leg: Secondary | ICD-10-CM

## 2023-12-14 DIAGNOSIS — Z7409 Other reduced mobility: Secondary | ICD-10-CM

## 2023-12-14 NOTE — Therapy (Signed)
 OUTPATIENT PHYSICAL THERAPY THORACOLUMBAR TREATMENT   Patient Name: Wesley Peck MRN: [id number] DOB:05/24/1971, 53 y.o., male Today's Date: 12/14/2023  END OF SESSION:  PT End of Session - 12/14/23 1607     Visit Number 2    Number of Visits 12    Date for PT Re-Evaluation 01/12/24    Authorization Type [employer]BS COMM PPO    Authorization Time Period seeking auth    Progress Note Due on Visit 10    PT Start Time 1607    PT Stop Time 1645    PT Time Calculation (min) 38 min    Activity Tolerance Patient tolerated treatment well    Behavior During Therapy WFL for tasks assessed/performed          Past Medical History:  Diagnosis Date   Herpes    Hypertension    No past surgical history on file. Patient Active Problem List   Diagnosis Date Noted   Low testosterone 10/28/2023   Abnormal thyroid function test 10/27/2012   Essential hypertension 10/27/2012    PCP: Margarete Maeola DASEN, FNP   REFERRING PROVIDER: Onesimo Oneil DELENA, MD  REFERRING DIAG: M54.50,M79.604,M79.605 (ICD-10-CM) - Lumbar pain with radiation down both legs  Rationale for Evaluation and Treatment: Rehabilitation  THERAPY DIAG:  Other low back pain  Impaired functional mobility and activity tolerance  Pain of right lower extremity  ONSET DATE: A long time ago  SUBJECTIVE:                                                                                                                                                                                           SUBJECTIVE STATEMENT: Pt reports little to no pian today in low back. Reports HEP going well. Reports pain is mostly in the mornings right after he wakes up.    Pt states he has been dealing with low back pain for years. 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 Pt states pain does go down both legs but Right is worse that Left. Pt does not know what started low  back pain, just came on. Pt states back has been hurting for over 10 years.   PERTINENT HISTORY:  -HTN, controlled  PAIN:  Are you having pain? Yes: NPRS scale: 6/10 Pain location: low back, both legs, right worse than left Pain description: sharp, shooting, burning Aggravating factors: early in the morning, sittting and standing Relieving factors: laying flat, getting up and moving  PRECAUTIONS: None  RED FLAGS: Bowel or bladder incontinence: Yes: sometimes can not  hold it. Pt states that he is urinating more than he used to. Only had one soda today and has used the bathroom several times   WEIGHT BEARING RESTRICTIONS: No  FALLS:  Has patient fallen in last 6 months? 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  DIAGNOSTIC FINDINGS:  Standing lumbar spine x-rays were obtained in clinic today.  No acute injuries noted.  There is no anterolisthesis.  Well-maintained disc height, with the exception of L5-S1.  At L5-S1, there are large anterior based osteophytes, and loss of disc height.  No bony lesions.   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  PATIENT SURVEYS:  Modified Oswestry 15/50   COGNITION: Overall cognitive status: Within functional limits for tasks assessed     SENSATION: Burning feeling in both feet, medial arch  POSTURE: rounded shoulders and forward head  PALPATION: Pt demonstrates tenderness to palpation of L5-S1 spinal segments and CPA and UPA mobilizations, tenderness also noted in R glute and bilateral greater trochanters.  LUMBAR ROM:   AROM eval  Flexion 52 degrees, pain in right side of bakc  Extension WFl  Right  lateral flexion 20, pain on R low back  Left lateral flexion 25, no pain  Right rotation 75% available  Left rotation 75% available   (Blank rows = not tested)  LOWER EXTREMITY ROM:     Active  Right eval Left eval  Hip flexion    Hip extension    Hip abduction    Hip adduction    Hip internal rotation    Hip external rotation    Knee flexion    Knee extension    Ankle dorsiflexion    Ankle plantarflexion    Ankle inversion    Ankle eversion     (Blank rows = not tested)  LOWER EXTREMITY MMT:    MMT Right eval Left eval  Hip flexion 4- 4+  Hip extension 4-, pain 4  Hip abduction 3+, pain 4-  Hip adduction    Hip internal rotation    Hip external rotation    Knee flexion 4- 4+  Knee extension 4- 4+  Ankle dorsiflexion 4- 4+  Ankle plantarflexion    Ankle inversion    Ankle eversion     (Blank rows = not tested)  LUMBAR SPECIAL TESTS:  Straight leg raise test: Positive and Slump test: Negative  FUNCTIONAL TESTS:  5 times sit to stand: 18.56, pain in right leg 2 minute walk test: 422, same pain level  GAIT: Distance walked: 422 feet Assistive device utilized: None Level of assistance: Complete Independence Comments: pt demonstrates decreased foot clearance with RLE compared to left, no increased pain reported and no increased deviations noted as progressed. Pt states he has to walk for 10 miles a day at work.  TREATMENT DATE:  12/14/23: Review of goals and HEP LTR, 10x Clamshells, 10 x ea side Bridge, 10x Bridge + abduction, RTB around knee, 2x10 Side Stepping + TA contraction, RTB around knees, 20 ftx2  +mini squat, 20 ftx2 Forward Lunges onto BOSU, 10x ea LE, UE support  10x ea LE, no UE support' Shoulder Rows, TA contraction, 2x10, GTB Shoulder  Extension, TA contraction, 2x10, GTB Paloff press, TA contraction, 2x10, GTB LE Press: 10x plate 3, 89k plate 4, 89k plate 5  3/87/7974  Evaluation: -ROM measured, Strength assessed, HEP  prescribed, pt educated on prognosis, findings, and importance of HEP compliance if given.                                                                                                                 PATIENT EDUCATION:  Education details: Pt was educated on findings of PT evaluation, prognosis, frequency of therapy visits and rationale, attendance policy, and HEP if given.   Person educated: Patient Education method: Explanation, Verbal cues, and Handouts Education comprehension: verbalized understanding, verbal cues required, and needs further education  HOME EXERCISE PROGRAM: Access Code: [facility] URL: [email or web address] Date: 12/01/2023 Prepared by: Lang Ada  Exercises - Supine Lower Trunk Rotation  - 1 x daily - 7 x weekly - 3 sets - 10 reps - Supine Bridge  - 1 x daily - 7 x weekly - 3 sets - 10 reps - 3 seconds hold - Clamshell  - 1 x daily - 7 x weekly - 3 sets - 10 reps  ASSESSMENT:  CLINICAL IMPRESSION: Began with review of goals. Followed with review of HEP. Verbal and tactile cueing for form with LTR and clamshells as pt initially demonstrating increased pain, educated to stay out of painful range. Progressed LE strengthening, combining bridge and hip abduction, and LE strengthening in weight bearing position. Challenged LE strength and balance with forward lunges, pt losing balance ~25% of time when performing without UE support. Addressed postural strength this date with emphasis on scapular retraction and TA contraction. Reports pain is mostly in the mornings when he wakes up. Says he usually is on his back or side sleeping. Encouraged to try adding pillow between knees when side sleeping to aid in morning discomfort.  Patient will benefit from continued skilled physical therapy in order to address his pain, LE strength, core strength and lumbar ROM to improve function//QOL.     Patient is a 53 y.o. male who was seen today for physical therapy  evaluation and treatment for M54.50,M79.604,M79.605 (ICD-10-CM) - Lumbar pain with radiation down both legs. Patient demonstrates low back pain, right LE pain, decreased RLE strength, and tenderness to palpation to L5-S1 spinal segments. Patient also demonstrates WFL gait pattern with decreased foot clearance of RLE noted during today's . Patient also demonstrates increase tone in lumbar paraspinal musculature, more on R side than left. Patient requires education on POC, role of PT and HEP prescription. Patient would benefit from skilled physical therapy for decreased pain, increased pain free endurance with ambulation, increased RLE strength, and activity tolerance for improved gait quality, return to higher level of function with ADLs, and progress towards therapy goals.    OBJECTIVE IMPAIRMENTS: Abnormal gait, decreased activity tolerance, decreased endurance, decreased mobility, difficulty walking, decreased ROM, decreased strength, impaired flexibility, impaired sensation, and pain.   ACTIVITY LIMITATIONS: carrying, lifting, bending, sitting, standing, squatting, and sleeping  PARTICIPATION  LIMITATIONS: cleaning, laundry, community activity, occupation, and yard work  PERSONAL FACTORS: Time since onset of injury/illness/exacerbation and 1 comorbidity: HTN are also affecting patient's functional outcome.   REHAB POTENTIAL: Fair chronic in nature  CLINICAL DECISION MAKING: Stable/uncomplicated  EVALUATION COMPLEXITY: Low   GOALS: Goals reviewed with patient? Yes  SHORT TERM GOALS: Target date: 12/22/23  Pt will be independent with HEP in order to demonstrate participation in Physical Therapy POC.  Baseline: Goal status: INITIAL  2.  Pt will report 4/10 pain at worst with mobility in order to demonstrate improved pain with ADLs.  Baseline: 6/10 Goal status: INITIAL  LONG TERM GOALS: Target date: 01/12/24  Pt will improve 5TSTS by 2.3 seconds in order to demonstrate improved  functional strength to return to desired activities.  Baseline: see objective.  Goal status: INITIAL  2.  Pt will improve 2 MWT by 50 feet in order to demonstrate improved functional ambulatory capacity in community setting.  Baseline: see objective.  Goal status: INITIAL  3.  Pt will improve Modified Oswestry score by at least 6 points in order to demonstrate improved pain with functional goals and outcomes. Baseline: see objective.  Goal status: INITIAL  4.  Pt will report 2/10 pain at worst with mobility in order to demonstrate reduced pain with ADLs lasting greater than 30 minutes.  Baseline: see objective.  Goal status: INITIAL   PLAN:  PT FREQUENCY: 1-2x/week  PT DURATION: 6 weeks  PLANNED INTERVENTIONS: 97110-Therapeutic exercises, 97530- Therapeutic activity, 97112- Neuromuscular re-education, 97535- Self Care, [id number]- Manual therapy, [phone]- Gait training, Patient/Family education, Balance training, Stair training, Joint mobilization, Spinal mobilization, DME instructions, Cryotherapy, and Moist heat.  PLAN FOR NEXT SESSION: Progress LE and core strengthening, manual to lumbar paraspinals, postural strengthening   5:07 PM, 12/14/23 Joselynn Amoroso Powell-Butler, PT, DPT [facility] [place]

## 2023-12-22 ENCOUNTER — Encounter (HOSPITAL_COMMUNITY): Payer: Worker's Compensation

## 2023-12-28 ENCOUNTER — Encounter (HOSPITAL_COMMUNITY): Payer: Worker's Compensation

## 2023-12-30 ENCOUNTER — Ambulatory Visit (HOSPITAL_COMMUNITY): Payer: Worker's Compensation | Attending: Orthopedic Surgery

## 2023-12-30 DIAGNOSIS — M5459 Other low back pain: Secondary | ICD-10-CM | POA: Diagnosis present

## 2023-12-30 DIAGNOSIS — M79604 Pain in right leg: Secondary | ICD-10-CM | POA: Diagnosis present

## 2023-12-30 DIAGNOSIS — Z7409 Other reduced mobility: Secondary | ICD-10-CM | POA: Insufficient documentation

## 2023-12-30 NOTE — Therapy (Signed)
 OUTPATIENT PHYSICAL THERAPY THORACOLUMBAR TREATMENT   Patient Name: Wesley Peck MRN: [id number] DOB:[birth year], 53 y.o., male Today's Date: 12/30/2023  END OF SESSION:  PT End of Session - 12/30/23 1050     Visit Number 3    Number of Visits 12    Date for PT Re-Evaluation 01/12/24    Authorization Type [employer] COMM PPO    Authorization Time Period seeking auth    Progress Note Due on Visit 10    PT Start Time 1055    PT Stop Time 1135    PT Time Calculation (min) 40 min    Activity Tolerance Patient tolerated treatment well    Behavior During Therapy WFL for tasks assessed/performed          Past Medical History:  Diagnosis Date   Herpes    Hypertension    No past surgical history on file. Patient Active Problem List   Diagnosis Date Noted   Low testosterone 10/28/2023   Abnormal thyroid function test 10/27/2012   Essential hypertension 10/27/2012    PCP: Margarete Maeola DASEN, FNP   REFERRING PROVIDER: Onesimo Oneil DELENA, MD  REFERRING DIAG: M54.50,M79.604,M79.605 (ICD-10-CM) - Lumbar pain with radiation down both legs  Rationale for Evaluation and Treatment: Rehabilitation  THERAPY DIAG:  Other low back pain  Impaired functional mobility and activity tolerance  Pain of right lower extremity  ONSET DATE: A long time ago  SUBJECTIVE:                                                                                                                                                                                           SUBJECTIVE STATEMENT: Decreased pain overall; just a little in my left hip; 2-3/10 only bothers me if I stand in a spot for a length of time.  Sees Dr. Onesimo on Tuesday 7/15  Pt states he has been dealing with low back pain for years. 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 Pt states pain does go down both legs but Right is worse that Left. Pt does not know what  started low back pain, just came on. Pt states back has been hurting for over 10 years.   PERTINENT HISTORY:  -HTN, controlled  PAIN:  Are you having pain? Yes: NPRS scale: 6/10 Pain location: low back, both legs, right worse than left Pain description: sharp, shooting, burning Aggravating factors: early in the morning, sittting and standing Relieving factors: laying flat, getting up and moving  PRECAUTIONS: None  RED FLAGS: Bowel or bladder incontinence:  Yes: sometimes can not hold it. Pt states that he is urinating more than he used to. Only had one soda today and has used the bathroom several times   WEIGHT BEARING RESTRICTIONS: No  FALLS:  Has patient fallen in last 6 months? 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  DIAGNOSTIC FINDINGS:  Standing lumbar spine x-rays were obtained in clinic today.  No acute injuries noted.  There is no anterolisthesis.  Well-maintained disc height, with the exception of L5-S1.  At L5-S1, there are large anterior based osteophytes, and loss of disc height.  No bony lesions.   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  PATIENT SURVEYS:  Modified Oswestry 15/50   COGNITION: Overall cognitive status: Within functional limits for tasks assessed     SENSATION: Burning feeling in both feet, medial arch  POSTURE: rounded shoulders and forward head  PALPATION: Pt demonstrates tenderness to palpation of L5-S1 spinal segments and CPA and UPA mobilizations, tenderness also noted in R glute and bilateral greater trochanters.  LUMBAR ROM:   AROM eval  Flexion 52 degrees, pain in right side of bakc  Extension WFl   Right lateral flexion 20, pain on R low back  Left lateral flexion 25, no pain  Right rotation 75% available  Left rotation 75% available   (Blank rows = not tested)  LOWER EXTREMITY ROM:     Active  Right eval Left eval  Hip flexion    Hip extension    Hip abduction    Hip adduction    Hip internal rotation    Hip external rotation    Knee flexion    Knee extension    Ankle dorsiflexion    Ankle plantarflexion    Ankle inversion    Ankle eversion     (Blank rows = not tested)  LOWER EXTREMITY MMT:    MMT Right eval Left eval  Hip flexion 4- 4+  Hip extension 4-, pain 4  Hip abduction 3+, pain 4-  Hip adduction    Hip internal rotation    Hip external rotation    Knee flexion 4- 4+  Knee extension 4- 4+  Ankle dorsiflexion 4- 4+  Ankle plantarflexion    Ankle inversion    Ankle eversion     (Blank rows = not tested)  LUMBAR SPECIAL TESTS:  Straight leg raise test: Positive and Slump test: Negative  FUNCTIONAL TESTS:  5 times sit to stand: 18.56, pain in right leg 2 minute walk test: 422, same pain level  GAIT: Distance walked: 422 feet Assistive device utilized: None Level of assistance: Complete Independence Comments: pt demonstrates decreased foot clearance with RLE compared to left, no increased pain reported and no increased deviations noted as progressed. Pt states he has to walk for 10 miles a day at work.  TREATMENT DATE:  12/30/23 Nustep seat 9 x 5' dynamic warm up. Heel raises x 20 no UE assist Slant board 5 x 20 Hip vectors 2 hold x 8 each 12 box 5 x 20 Shoulder rows green theraband 2 x 10 Shoulder extensions green theraband 2 x 10 Paloff press green theraband 2 x 10 Lumbar extension  over the bar 2 x 10 Seated lumbar extension 2 x 10 Thoracic rotation x 10 Cervical extension   12/14/23: Review of goals and HEP LTR, 10x Clamshells, 10 x ea side Bridge, 10x Bridge + abduction, RTB around knee, 2x10 Side Stepping + TA  contraction, RTB around knees, 20 ftx2  +mini squat, 20 ftx2 Forward Lunges onto BOSU, 10x ea LE, UE support  10x ea LE, no UE support' Shoulder Rows, TA contraction, 2x10, GTB Shoulder Extension, TA contraction, 2x10, GTB Paloff press, TA contraction, 2x10, GTB LE Press: 10x plate 3, 89k plate 4, 89k plate 5  3/87/7974  Evaluation: -ROM measured, Strength assessed, HEP prescribed, pt educated on prognosis, findings, and importance of HEP compliance if given.                                                                                                                 PATIENT EDUCATION:  Education details: Pt was educated on findings of PT evaluation, prognosis, frequency of therapy visits and rationale, attendance policy, and HEP if given.   Person educated: Patient Education method: Explanation, Verbal cues, and Handouts Education comprehension: verbalized understanding, verbal cues required, and needs further education  HOME EXERCISE PROGRAM: Access Code: [email or web address] Date: 12/30/2023  Exercises - Scapular Retraction with Resistance  - 1 x daily - 7 x weekly - 2 sets - 10 reps - Shoulder Extension with Resistance  - 1 x daily - 7 x weekly - 2 sets - 10 reps - Standing Anti-Rotation Press with Anchored Resistance  - 1 x daily - 7 x weekly - 2 sets - 10 reps - Standing Lumbar Extension with Counter  - 1 x daily - 7 x weekly - 2 sets - 10 reps - 3 way kick holding to counter for support  - 1 x daily - 7 x weekly - 2 sets - 8 reps - 3 sec hold  Access Code: [facility] URL: [email or web address] Date: 12/01/2023 Prepared by: Lang Ada  Exercises - Supine Lower Trunk Rotation  - 1 x daily - 7 x weekly - 3 sets - 10 reps - Supine Bridge  - 1 x daily - 7 x weekly - 3 sets - 10 reps - 3 seconds hold - Clamshell  - 1 x daily - 7 x weekly - 3 sets - 10 reps  ASSESSMENT:  CLINICAL IMPRESSION:   Minimal pain on arrival today.  Started on Nustep for a dynamic warm up.  Added hip vectors today to address hip weakness bilaterally.  Noted forward head and rounded shoulders so added in extension movement, stretches today.  Updated HEP.  No report of increased pain with treatment today.   Patient will benefit from continued skilled physical therapy in order to address his pain, LE strength, core strength and lumbar ROM to improve function//QOL.     Patient is a 53 y.o. male who was seen today for physical therapy evaluation and treatment for M54.50,M79.604,M79.605 (ICD-10-CM) - Lumbar pain with radiation down both legs. Patient demonstrates low back pain,  right LE pain, decreased RLE strength, and tenderness to palpation to L5-S1 spinal segments. Patient also demonstrates WFL gait pattern with decreased foot clearance of RLE noted during today's . Patient also demonstrates increase tone in lumbar paraspinal musculature, more on R side than left. Patient requires education on POC, role of PT and HEP prescription. Patient would benefit from skilled physical therapy for decreased pain, increased pain free endurance with ambulation, increased RLE strength, and activity tolerance for improved gait quality, return to higher level of function with ADLs, and progress towards therapy goals.    OBJECTIVE IMPAIRMENTS: Abnormal gait, decreased activity tolerance, decreased endurance, decreased mobility, difficulty walking, decreased ROM, decreased strength, impaired flexibility, impaired sensation, and pain.   ACTIVITY LIMITATIONS: carrying, lifting, bending, sitting, standing, squatting, and sleeping  PARTICIPATION LIMITATIONS: cleaning, laundry, community activity, occupation, and yard work  PERSONAL FACTORS: Time since onset of injury/illness/exacerbation and 1 comorbidity: HTN are also affecting patient's functional outcome.   REHAB POTENTIAL: Fair chronic in nature  CLINICAL DECISION MAKING: Stable/uncomplicated  EVALUATION COMPLEXITY: Low   GOALS: Goals reviewed  with patient? Yes  SHORT TERM GOALS: Target date: 12/22/23  Pt will be independent with HEP in order to demonstrate participation in Physical Therapy POC.  Baseline: Goal status: IN PROGRESS  2.  Pt will report 4/10 pain at worst with mobility in order to demonstrate improved pain with ADLs.  Baseline: 6/10 Goal status: IN PROGRESS  LONG TERM GOALS: Target date: 01/12/24  Pt will improve 5TSTS by 2.3 seconds in order to demonstrate improved functional strength to return to desired activities.  Baseline: see objective.  Goal status: IN PROGRESS  2.  Pt will improve 2 MWT by 50 feet in order to demonstrate improved functional ambulatory capacity in community setting.  Baseline: see objective.  Goal status: IN PROGRESS  3.  Pt will improve Modified Oswestry score by at least 6 points in order to demonstrate improved pain with functional goals and outcomes. Baseline: see objective.  Goal status: IN PROGRESS  4.  Pt will report 2/10 pain at worst with mobility in order to demonstrate reduced pain with ADLs lasting greater than 30 minutes.  Baseline: see objective.  Goal status: IN PROGRESS   PLAN:  PT FREQUENCY: 1-2x/week  PT DURATION: 6 weeks  PLANNED INTERVENTIONS: 97110-Therapeutic exercises, 97530- Therapeutic activity, 97112- Neuromuscular re-education, 97535- Self Care, [id number]- Manual therapy, [phone]- Gait training, Patient/Family education, Balance training, Stair training, Joint mobilization, Spinal mobilization, DME instructions, Cryotherapy, and Moist heat.  PLAN FOR NEXT SESSION: Progress LE and core strengthening, manual to lumbar paraspinals, postural strengthening  11:40 AM, 12/30/23 Delphine Sizemore Small Kiernan Farkas MPT [facility] [place] [phone] Ph:[phone]

## 2024-01-03 ENCOUNTER — Ambulatory Visit: Payer: Worker's Compensation | Admitting: Orthopedic Surgery

## 2024-01-03 ENCOUNTER — Encounter: Payer: Self-pay | Admitting: Orthopedic Surgery

## 2024-01-03 DIAGNOSIS — M79604 Pain in right leg: Secondary | ICD-10-CM

## 2024-01-03 DIAGNOSIS — M545 Low back pain, unspecified: Secondary | ICD-10-CM

## 2024-01-03 DIAGNOSIS — M79605 Pain in left leg: Secondary | ICD-10-CM | POA: Diagnosis not present

## 2024-01-03 NOTE — Progress Notes (Signed)
 Return patient Visit  Assessment: Wesley Peck is a 53 y.o. male with the following: 1. Lumbar pain with radiation down both legs; improved  Plan: Wesley Peck is doing much better.  He has completed some therapy.  He takes Aleve occasionally.  His pain is much better.  No radiating pains.  Continue medications as needed.  Remember the exercises from therapy.  If he has a further issues, he will contact the clinic.  Follow-up: Return if symptoms worsen or fail to improve.  Subjective:  Chief Complaint  Patient presents with   Follow-up    2 month follow up on lumbar pain- has resolved.    History of Present Illness: Wesley Peck is a 52 y.o. male who returns for repeat evaluation of lower back pain.  He has been taking some Aleve as needed.  He has worked with therapy.  He states he is no longer having any back pain.  No radiating pains.  No numbness or tingling.  Review of Systems: No fevers or chills No numbness or tingling No chest pain No shortness of breath No bowel or bladder dysfunction No GI distress No headaches   Objective: There were no vitals taken for this visit.  Physical Exam:  General: Alert and oriented. and No acute distress. Gait: Normal gait.  Evaluation of lumbar spine demonstrates no deformity.  No redness.  No point tenderness.  Negative straight leg raise bilaterally.  He has excellent lower body strength bilaterally.  2+ patellar tendon reflexes bilateral.  Sensation intact throughout both legs at this time.  IMAGING: I personally ordered and reviewed the following images   No new imaging obtained today.   New Medications:  No orders of the defined types were placed in this encounter.     [name], MD  01/03/2024 11:33 AM

## 2024-01-04 ENCOUNTER — Encounter (HOSPITAL_COMMUNITY): Payer: Worker's Compensation

## 2024-01-06 ENCOUNTER — Ambulatory Visit (HOSPITAL_COMMUNITY): Payer: Worker's Compensation

## 2024-01-06 DIAGNOSIS — M5459 Other low back pain: Secondary | ICD-10-CM

## 2024-01-06 DIAGNOSIS — Z7409 Other reduced mobility: Secondary | ICD-10-CM

## 2024-01-06 DIAGNOSIS — M79604 Pain in right leg: Secondary | ICD-10-CM

## 2024-01-06 NOTE — Therapy (Signed)
 OUTPATIENT PHYSICAL THERAPY THORACOLUMBAR TREATMENT   Patient Name: Wesley Peck MRN: [id number] DOB:Oct 01, 1970, 53 y.o., male Today's Date: 01/06/2024  END OF SESSION:  PT End of Session - 01/06/24 1138     Visit Number 4    Number of Visits 12    Date for PT Re-Evaluation 01/12/24    Authorization Type [employer] COMM PPO    Authorization Time Period seeking auth    Progress Note Due on Visit 10    PT Start Time 1110    PT Stop Time 1150    PT Time Calculation (min) 40 min    Activity Tolerance Patient tolerated treatment well    Behavior During Therapy WFL for tasks assessed/performed           Past Medical History:  Diagnosis Date   Herpes    Hypertension    No past surgical history on file. Patient Active Problem List   Diagnosis Date Noted   Low testosterone 10/28/2023   Abnormal thyroid function test 10/27/2012   Essential hypertension 10/27/2012    PCP: Margarete Maeola DASEN, FNP   REFERRING PROVIDER: Onesimo Oneil DELENA, MD  REFERRING DIAG: M54.50,M79.604,M79.605 (ICD-10-CM) - Lumbar pain with radiation down both legs  Rationale for Evaluation and Treatment: Rehabilitation  THERAPY DIAG:  Other low back pain  Impaired functional mobility and activity tolerance  Pain of right lower extremity  ONSET DATE: A long time ago  SUBJECTIVE:                                                                                                                                                                                           SUBJECTIVE STATEMENT: Saw MD on Tuesday; pleased with progress.  2 days with no pain.    Pt states he has been dealing with low back pain for years. 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 Pt states pain does go down both legs but Right is worse that Left. Pt does not know what started low back pain, just came on. Pt states back has been hurting for over 10  years.   PERTINENT HISTORY:  -HTN, controlled  PAIN:  Are you having pain? Yes: NPRS scale: 6/10 Pain location: low back, both legs, right worse than left Pain description: sharp, shooting, burning Aggravating factors: early in the morning, sittting and standing Relieving factors: laying flat, getting up and moving  PRECAUTIONS: None  RED FLAGS: Bowel or bladder incontinence: Yes: sometimes can not hold it. Pt states that he is urinating more than he used  to. Only had one soda today and has used the bathroom several times   WEIGHT BEARING RESTRICTIONS: No  FALLS:  Has patient fallen in last 6 months? 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  DIAGNOSTIC FINDINGS:  Standing lumbar spine x-rays were obtained in clinic today.  No acute injuries noted.  There is no anterolisthesis.  Well-maintained disc height, with the exception of L5-S1.  At L5-S1, there are large anterior based osteophytes, and loss of disc height.  No bony lesions.   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  PATIENT SURVEYS:  Modified Oswestry 15/50   COGNITION: Overall cognitive status: Within functional limits for tasks assessed     SENSATION: Burning feeling in both feet, medial arch  POSTURE: rounded shoulders and forward head  PALPATION: Pt demonstrates tenderness to palpation of L5-S1 spinal segments and CPA and UPA mobilizations, tenderness also noted in R glute and bilateral greater trochanters.  LUMBAR ROM:   AROM eval  Flexion 52 degrees, pain in right side of bakc  Extension WFl  Right lateral flexion 20, pain on R low back  Left lateral flexion 25, no  pain  Right rotation 75% available  Left rotation 75% available   (Blank rows = not tested)  LOWER EXTREMITY ROM:     Active  Right eval Left eval  Hip flexion    Hip extension    Hip abduction    Hip adduction    Hip internal rotation    Hip external rotation    Knee flexion    Knee extension    Ankle dorsiflexion    Ankle plantarflexion    Ankle inversion    Ankle eversion     (Blank rows = not tested)  LOWER EXTREMITY MMT:    MMT Right eval Left eval  Hip flexion 4- 4+  Hip extension 4-, pain 4  Hip abduction 3+, pain 4-  Hip adduction    Hip internal rotation    Hip external rotation    Knee flexion 4- 4+  Knee extension 4- 4+  Ankle dorsiflexion 4- 4+  Ankle plantarflexion    Ankle inversion    Ankle eversion     (Blank rows = not tested)  LUMBAR SPECIAL TESTS:  Straight leg raise test: Positive and Slump test: Negative  FUNCTIONAL TESTS:  5 times sit to stand: 18.56, pain in right leg 2 minute walk test: 422, same pain level  GAIT: Distance walked: 422 feet Assistive device utilized: None Level of assistance: Complete Independence Comments: pt demonstrates decreased foot clearance with RLE compared to left, no increased pain reported and no increased deviations noted as progressed. Pt states he has to walk for 10 miles a day at work.  TREATMENT DATE:  01/06/24 Nustep seat 9 x 5' level 3  Heel raises on step 2 x 10 Slant board 5 x 20 Sit to stand with green theraband around knees 2 x 10 Standing hip abduction green theraband 2 x 10 Hip extension green theraband around knees 2 x 10 Bodycraft leg press 5 plates 3 x 10 Hamstring curl 4 plates 3 x 10 Blue theraband scapular retractions 2 x 10 Blue  theraband shoulder extension 2 x 10 Blue theraband Paloff press 2 x 10 Lumbar extension over the bar x 10 Alternating lunges onto BOSU x 8 each   12/30/23 Nustep seat 9 x 5' dynamic warm up. Heel raises x 20 no UE assist Slant board 5 x  20 Hip vectors 2 hold x 8 each 12 box 5 x 20 Shoulder rows green theraband 2 x 10 Shoulder extensions green theraband 2 x 10 Paloff press green theraband 2 x 10 Lumbar extension over the bar 2 x 10 Seated lumbar extension 2 x 10 Thoracic rotation x 10 Cervical extension x 10  12/14/23: Review of goals and HEP LTR, 10x Clamshells, 10 x ea side Bridge, 10x Bridge + abduction, RTB around knee, 2x10 Side Stepping + TA contraction, RTB around knees, 20 ftx2  +mini squat, 20 ftx2 Forward Lunges onto BOSU, 10x ea LE, UE support  10x ea LE, no UE support' Shoulder Rows, TA contraction, 2x10, GTB Shoulder Extension, TA contraction, 2x10, GTB Paloff press, TA contraction, 2x10, GTB LE Press: 10x plate 3, 89k plate 4, 89k plate 5  3/87/7974  Evaluation: -ROM measured, Strength assessed, HEP prescribed, pt educated on prognosis, findings, and importance of HEP compliance if given.                                                                                                                 PATIENT EDUCATION:  Education details: Pt was educated on findings of PT evaluation, prognosis, frequency of therapy visits and rationale, attendance policy, and HEP if given.   Person educated: Patient Education method: Explanation, Verbal cues, and Handouts Education comprehension: verbalized understanding, verbal cues required, and needs further education  HOME EXERCISE PROGRAM: Access Code: [email or web address] Date: 12/30/2023  Exercises - Scapular Retraction with Resistance  - 1 x daily - 7 x weekly - 2 sets - 10 reps - Shoulder Extension with Resistance  - 1 x daily - 7 x weekly - 2 sets - 10 reps - Standing Anti-Rotation Press with Anchored Resistance  - 1 x daily - 7 x weekly - 2 sets - 10 reps - Standing Lumbar Extension with Counter  - 1 x daily - 7 x weekly - 2 sets - 10 reps - 3 way kick holding to counter for support  - 1 x daily - 7 x weekly - 2 sets - 8 reps - 3 sec hold  Access Code:  [facility] URL: [email or web address] Date: 12/01/2023 Prepared by: Lang Ada  Exercises - Supine Lower Trunk Rotation  - 1 x daily - 7 x weekly - 3 sets - 10 reps - Supine Bridge  - 1 x daily - 7 x weekly - 3 sets - 10 reps - 3 seconds hold - Clamshell  - 1 x daily - 7 x weekly - 3 sets - 10 reps  ASSESSMENT:  CLINICAL IMPRESSION:   No pain on arrival today.  Started on Nustep for a dynamic warm up. Progressed PRE today adding leg press and hamstring curls; increased scapular  retractions, extensions and Paloff press to blue theraband.   No report of increased pain with treatment today.   Patient will benefit from continued skilled physical therapy in order to address his pain, LE strength, core strength and lumbar ROM to improve function//QOL.     Patient is a 53 y.o. male who was seen today for physical therapy evaluation and treatment for M54.50,M79.604,M79.605 (ICD-10-CM) - Lumbar pain with radiation down both legs. Patient demonstrates low back pain, right LE pain, decreased RLE strength, and tenderness to palpation to L5-S1 spinal segments. Patient also demonstrates WFL gait pattern with decreased foot clearance of RLE noted during today's . Patient also demonstrates increase tone in lumbar paraspinal musculature, more on R side than left. Patient requires education on POC, role of PT and HEP prescription. Patient would benefit from skilled physical therapy for decreased pain, increased pain free endurance with ambulation, increased RLE strength, and activity tolerance for improved gait quality, return to higher level of function with ADLs, and progress towards therapy goals.    OBJECTIVE IMPAIRMENTS: Abnormal gait, decreased activity tolerance, decreased endurance, decreased mobility, difficulty walking, decreased ROM, decreased strength, impaired flexibility, impaired sensation, and pain.   ACTIVITY LIMITATIONS: carrying, lifting, bending, sitting, standing,  squatting, and sleeping  PARTICIPATION LIMITATIONS: cleaning, laundry, community activity, occupation, and yard work  PERSONAL FACTORS: Time since onset of injury/illness/exacerbation and 1 comorbidity: HTN are also affecting patient's functional outcome.   REHAB POTENTIAL: Fair chronic in nature  CLINICAL DECISION MAKING: Stable/uncomplicated  EVALUATION COMPLEXITY: Low   GOALS: Goals reviewed with patient? Yes  SHORT TERM GOALS: Target date: 12/22/23  Pt will be independent with HEP in order to demonstrate participation in Physical Therapy POC.  Baseline: Goal status: IN PROGRESS  2.  Pt will report 4/10 pain at worst with mobility in order to demonstrate improved pain with ADLs.  Baseline: 6/10 Goal status: IN PROGRESS  LONG TERM GOALS: Target date: 01/12/24  Pt will improve 5TSTS by 2.3 seconds in order to demonstrate improved functional strength to return to desired activities.  Baseline: see objective.  Goal status: IN PROGRESS  2.  Pt will improve 2 MWT by 50 feet in order to demonstrate improved functional ambulatory capacity in community setting.  Baseline: see objective.  Goal status: IN PROGRESS  3.  Pt will improve Modified Oswestry score by at least 6 points in order to demonstrate improved pain with functional goals and outcomes. Baseline: see objective.  Goal status: IN PROGRESS  4.  Pt will report 2/10 pain at worst with mobility in order to demonstrate reduced pain with ADLs lasting greater than 30 minutes.  Baseline: see objective.  Goal status: IN PROGRESS   PLAN:  PT FREQUENCY: 1-2x/week  PT DURATION: 6 weeks  PLANNED INTERVENTIONS: 97110-Therapeutic exercises, 97530- Therapeutic activity, 97112- Neuromuscular re-education, 97535- Self Care, 02859- Manual therapy, [phone]- Gait training, Patient/Family education, Balance training, Stair training, Joint mobilization, Spinal mobilization, DME instructions, Cryotherapy, and Moist heat.  PLAN FOR  NEXT SESSION: Progress LE and core strengthening, manual to lumbar paraspinals, postural strengthening; possible reassess for discharge next visit  11:51 AM, 01/06/24 Kilani Joffe Small Rondall Radigan MPT [facility] [place] [phone] Ph:[phone]

## 2024-01-12 ENCOUNTER — Encounter (HOSPITAL_COMMUNITY): Payer: Worker's Compensation

## 2024-01-13 ENCOUNTER — Ambulatory Visit (HOSPITAL_COMMUNITY): Payer: Worker's Compensation

## 2024-01-13 DIAGNOSIS — M5459 Other low back pain: Secondary | ICD-10-CM | POA: Diagnosis not present

## 2024-01-13 DIAGNOSIS — Z7409 Other reduced mobility: Secondary | ICD-10-CM

## 2024-01-13 DIAGNOSIS — M79604 Pain in right leg: Secondary | ICD-10-CM

## 2024-01-13 NOTE — Therapy (Signed)
 OUTPATIENT PHYSICAL THERAPY THORACOLUMBAR DISCHARGE PHYSICAL THERAPY DISCHARGE SUMMARY  Visits from Start of Care: 5  Current functional level related to goals / functional outcomes: See below   Remaining deficits: See below   Education / Equipment: HEP   Patient agrees to discharge. Patient goals were met. Patient is being discharged due to meeting the stated rehab goals.    Patient Name: Wesley Peck MRN: [id number] DOB:02/10/1971, 53 y.o., male Today's Date: 01/13/2024  END OF SESSION:  PT End of Session - 01/13/24 1123     Visit Number 5    Number of Visits 12    Date for PT Re-Evaluation 01/12/24    PT Start Time 1100    PT Stop Time 1124    PT Time Calculation (min) 24 min    Activity Tolerance Patient tolerated treatment well    Behavior During Therapy [facility] for tasks assessed/performed           Past Medical History:  Diagnosis Date   Herpes    Hypertension    No past surgical history on file. Patient Active Problem List   Diagnosis Date Noted   Low testosterone 10/28/2023   Abnormal thyroid function test 10/27/2012   Essential hypertension 10/27/2012    PCP: Margarete Maeola DASEN, FNP   REFERRING PROVIDER: Onesimo Oneil DELENA, MD  REFERRING DIAG: M54.50,M79.604,M79.605 (ICD-10-CM) - Lumbar pain with radiation down both legs  Rationale for Evaluation and Treatment: Rehabilitation  THERAPY DIAG:  Other low back pain  Impaired functional mobility and activity tolerance  Pain of right lower extremity  ONSET DATE: A long time ago  SUBJECTIVE:                                                                                                                                                                                           SUBJECTIVE STATEMENT: Patient has had no pain this week; ready for discharge    Pt states he has been dealing with low back pain for years. 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 Pt states pain does go down both legs but Right is worse that Left. Pt does not know what started low back pain, just came on. Pt states back has been hurting for over 10 years.   PERTINENT HISTORY:  -HTN, controlled  PAIN:  Are you having pain? Yes: NPRS scale: 6/10 Pain location: low back, both legs, right worse than left Pain description: sharp, shooting, burning Aggravating factors: early in the morning, sittting and standing Relieving factors: laying flat, getting  up and moving  PRECAUTIONS: None  RED FLAGS: Bowel or bladder incontinence: Yes: sometimes can not hold it. Pt states that he is urinating more than he used to. Only had one soda today and has used the bathroom several times   WEIGHT BEARING RESTRICTIONS: No  FALLS:  Has patient fallen in last 6 months? 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  DIAGNOSTIC FINDINGS:  Standing lumbar spine x-rays were obtained in clinic today.  No acute injuries noted.  There is no anterolisthesis.  Well-maintained disc height, with the exception of L5-S1.  At L5-S1, there are large anterior based osteophytes, and loss of disc height.  No bony lesions.   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  PATIENT SURVEYS:  Modified Oswestry 15/50   COGNITION: Overall cognitive status: Within functional limits for tasks assessed     SENSATION: Burning feeling in both feet, medial arch  POSTURE: rounded shoulders and forward head  PALPATION: Pt demonstrates tenderness to palpation of L5-S1 spinal segments and CPA and UPA mobilizations, tenderness also noted in R  glute and bilateral greater trochanters.  LUMBAR ROM:   AROM eval  Flexion 52 degrees, pain in right side of bakc  Extension WFl  Right lateral flexion 20, pain on R low back  Left lateral flexion 25, no pain  Right rotation 75% available  Left rotation 75% available   (Blank rows = not tested)  LOWER EXTREMITY ROM:     Active  Right eval Left eval  Hip flexion    Hip extension    Hip abduction    Hip adduction    Hip internal rotation    Hip external rotation    Knee flexion    Knee extension    Ankle dorsiflexion    Ankle plantarflexion    Ankle inversion    Ankle eversion     (Blank rows = not tested)  LOWER EXTREMITY MMT:    MMT Right eval Left eval Right 01/13/24 Left 01/13/24  Hip flexion 4- 4+ 4+ 5  Hip extension 4-, pain 4    Hip abduction 3+, pain 4-    Hip adduction      Hip internal rotation      Hip external rotation      Knee flexion 4- 4+    Knee extension 4- 4+ 5 5  Ankle dorsiflexion 4- 4+ 5 5  Ankle plantarflexion      Ankle inversion      Ankle eversion       (Blank rows = not tested)  LUMBAR SPECIAL TESTS:  Straight leg raise test: Positive and Slump test: Negative  FUNCTIONAL TESTS:  5 times sit to stand: 18.56, pain in right leg 2 minute walk test: 422, same pain level  GAIT: Distance walked: 422 feet Assistive device utilized: None Level of assistance: Complete Independence Comments: pt demonstrates decreased foot clearance with RLE compared to left, no increased pain reported and no increased deviations noted as progressed. Pt states he has to walk for 10 miles a day at work.  TREATMENT DATE:  01/13/24 Nustep seat 9 level 3 x 5' dynamic warm up Progress note 2 MWT  486 ft no AD 5 times sit to stand 7.78 sec no pain Modified Oswestry 0/50 MMT's see above Goal review  01/06/24 Nustep seat 9 x 5' level 3  Heel raises on step 2 x 10 Slant board 5 x 20 Sit to stand with green theraband around knees 2 x 10 Standing  hip abduction green theraband 2 x 10 Hip extension green theraband around knees 2 x 10 Bodycraft leg press 5 plates 3 x 10 Hamstring curl 4 plates 3 x 10 Blue theraband scapular retractions 2 x 10 Blue theraband shoulder extension 2 x 10 Blue theraband Paloff press 2 x 10 Lumbar extension over the bar x 10 Alternating lunges onto BOSU x 8 each   12/30/23 Nustep seat 9 x 5' dynamic warm up. Heel raises x 20 no UE assist Slant board 5 x 20 Hip vectors 2 hold x 8 each 12 box 5 x 20 Shoulder rows green theraband 2 x 10 Shoulder extensions green theraband 2 x 10 Paloff press green theraband 2 x 10 Lumbar extension over the bar 2 x 10 Seated lumbar extension 2 x 10 Thoracic rotation x 10 Cervical extension x 10  12/14/23: Review of goals and HEP LTR, 10x Clamshells, 10 x ea side Bridge, 10x Bridge + abduction, RTB around knee, 2x10 Side Stepping + TA contraction, RTB around knees, 20 ftx2  +mini squat, 20 ftx2 Forward Lunges onto BOSU, 10x ea LE, UE support  10x ea LE, no UE support' Shoulder Rows, TA contraction, 2x10, GTB Shoulder Extension, TA contraction, 2x10, GTB Paloff press, TA contraction, 2x10, GTB LE Press: 10x plate 3, 89k plate 4, 89k plate 5  3/87/7974  Evaluation: -ROM measured, Strength assessed, HEP prescribed, pt educated on prognosis, findings, and importance of HEP compliance if given.                                                                                                                 PATIENT EDUCATION:  Education details: Pt was educated on findings of PT evaluation, prognosis, frequency of therapy visits and rationale, attendance policy, and HEP if given.   Person educated: Patient Education method: Explanation, Verbal cues, and Handouts Education comprehension: verbalized understanding, verbal cues required, and needs further education  HOME EXERCISE PROGRAM: Access Code: [email or web address] Date: 12/30/2023  Exercises - Scapular  Retraction with Resistance  - 1 x daily - 7 x weekly - 2 sets - 10 reps - Shoulder Extension with Resistance  - 1 x daily - 7 x weekly - 2 sets - 10 reps - Standing Anti-Rotation Press with Anchored Resistance  - 1 x daily - 7 x weekly - 2 sets - 10 reps - Standing Lumbar Extension with Counter  - 1 x daily - 7 x weekly - 2 sets - 10 reps - 3 way kick holding to counter for support  - 1 x daily - 7 x weekly - 2 sets - 8 reps - 3 sec hold  Access Code: [facility] URL: [email or web address] Date: 12/01/2023 Prepared by: Jacob  Moore  Exercises - Supine Lower Trunk Rotation  - 1 x daily - 7 x weekly - 3 sets - 10 reps - Supine Bridge  - 1 x daily - 7 x weekly - 3 sets - 10 reps - 3 seconds hold - Clamshell  - 1 x daily - 7 x weekly - 3 sets - 10 reps  ASSESSMENT:  CLINICAL IMPRESSION:   No pain on arrival today.  Started on Nustep for a dynamic warm up. Progress note; patient has met all set rehab goals and is agreeable to discharge at this time.      Patient is a 53 y.o. male who was seen today for physical therapy evaluation and treatment for M54.50,M79.604,M79.605 (ICD-10-CM) - Lumbar pain with radiation down both legs. Patient demonstrates low back pain, right LE pain, decreased RLE strength, and tenderness to palpation to L5-S1 spinal segments. Patient also demonstrates WFL gait pattern with decreased foot clearance of RLE noted during today's . Patient also demonstrates increase tone in lumbar paraspinal musculature, more on R side than left. Patient requires education on POC, role of PT and HEP prescription. Patient would benefit from skilled physical therapy for decreased pain, increased pain free endurance with ambulation, increased RLE strength, and activity tolerance for improved gait quality, return to higher level of function with ADLs, and progress towards therapy goals.    OBJECTIVE IMPAIRMENTS: Abnormal gait, decreased activity tolerance, decreased endurance,  decreased mobility, difficulty walking, decreased ROM, decreased strength, impaired flexibility, impaired sensation, and pain.   ACTIVITY LIMITATIONS: carrying, lifting, bending, sitting, standing, squatting, and sleeping  PARTICIPATION LIMITATIONS: cleaning, laundry, community activity, occupation, and yard work  PERSONAL FACTORS: Time since onset of injury/illness/exacerbation and 1 comorbidity: HTN are also affecting patient's functional outcome.   REHAB POTENTIAL: Fair chronic in nature  CLINICAL DECISION MAKING: Stable/uncomplicated  EVALUATION COMPLEXITY: Low   GOALS: Goals reviewed with patient? Yes  SHORT TERM GOALS: Target date: 12/22/23  Pt will be independent with HEP in order to demonstrate participation in Physical Therapy POC.  Baseline: Goal status: MET  2.  Pt will report 4/10 pain at worst with mobility in order to demonstrate improved pain with ADLs.  Baseline: 6/10 Goal status: MET  LONG TERM GOALS: Target date: 01/12/24  Pt will improve 5TSTS by 2.3 seconds in order to demonstrate improved functional strength to return to desired activities.  Baseline: see objective.  Goal status: MET  2.  Pt will improve 2 MWT by 50 feet in order to demonstrate improved functional ambulatory capacity in community setting.  Baseline: see objective.  Goal status: MET  3.  Pt will improve Modified Oswestry score by at least 6 points in order to demonstrate improved pain with functional goals and outcomes. Baseline: see objective.  Goal status: MET  4.  Pt will report 2/10 pain at worst with mobility in order to demonstrate reduced pain with ADLs lasting greater than 30 minutes.  Baseline: see objective.  Goal status: MET   PLAN:  PT FREQUENCY: 1-2x/week  PT DURATION: 6 weeks  PLANNED INTERVENTIONS: 97110-Therapeutic exercises, 97530- Therapeutic activity, 97112- Neuromuscular re-education, 97535- Self Care, 02859- Manual therapy, [phone]- Gait training,  Patient/Family education, Balance training, Stair training, Joint mobilization, Spinal mobilization, DME instructions, Cryotherapy, and Moist heat.  PLAN FOR NEXT SESSION: discharge  11:23 AM, 01/13/24 Ha Shannahan Small Kathaleya Mcduffee MPT [place] [facility] [place] [phone]

## 2024-01-18 ENCOUNTER — Encounter (HOSPITAL_COMMUNITY): Payer: Worker's Compensation

## 2024-01-20 ENCOUNTER — Encounter (HOSPITAL_COMMUNITY): Payer: Worker's Compensation

## 2024-01-27 ENCOUNTER — Encounter (HOSPITAL_COMMUNITY): Payer: Worker's Compensation

## 2024-02-03 ENCOUNTER — Encounter (HOSPITAL_COMMUNITY): Payer: Worker's Compensation

## 2024-02-10 ENCOUNTER — Encounter (HOSPITAL_COMMUNITY): Payer: Worker's Compensation

## 2024-02-17 ENCOUNTER — Encounter (HOSPITAL_COMMUNITY): Payer: Worker's Compensation
